# Patient Record
Sex: Female | Born: 1950 | Race: White | Hispanic: No | Marital: Married | Smoking: Light tobacco smoker
Health system: Southern US, Community
[De-identification: ages and names within clinical notes are randomized; demographics above are authoritative.]

## PROBLEM LIST (undated history)

## (undated) DIAGNOSIS — H409 Unspecified glaucoma: Secondary | ICD-10-CM

---

## 2006-02-21 ENCOUNTER — Ambulatory Visit: Payer: Self-pay | Admitting: Obstetrics and Gynecology

## 2007-06-20 ENCOUNTER — Ambulatory Visit: Payer: Self-pay | Admitting: Obstetrics and Gynecology

## 2010-07-14 ENCOUNTER — Ambulatory Visit: Payer: Self-pay | Admitting: Internal Medicine

## 2014-05-16 ENCOUNTER — Emergency Department: Payer: Self-pay | Admitting: Emergency Medicine

## 2015-09-08 DIAGNOSIS — Z86007 Personal history of in-situ neoplasm of skin: Secondary | ICD-10-CM

## 2015-09-08 HISTORY — DX: Personal history of in-situ neoplasm of skin: Z86.007

## 2017-10-06 ENCOUNTER — Other Ambulatory Visit: Payer: Self-pay

## 2017-10-06 ENCOUNTER — Emergency Department
Admission: EM | Admit: 2017-10-06 | Discharge: 2017-10-06 | Disposition: A | Discharge status: Home / Self Care | Payer: No Typology Code available for payment source | Attending: Emergency Medicine | Admitting: Emergency Medicine

## 2017-10-06 ENCOUNTER — Emergency Department: Payer: No Typology Code available for payment source

## 2017-10-06 ENCOUNTER — Encounter: Payer: Self-pay | Admitting: Emergency Medicine

## 2017-10-06 DIAGNOSIS — F17200 Nicotine dependence, unspecified, uncomplicated: Secondary | ICD-10-CM | POA: Diagnosis not present

## 2017-10-06 DIAGNOSIS — Y9389 Activity, other specified: Secondary | ICD-10-CM | POA: Insufficient documentation

## 2017-10-06 DIAGNOSIS — M542 Cervicalgia: Secondary | ICD-10-CM | POA: Insufficient documentation

## 2017-10-06 HISTORY — DX: Unspecified glaucoma: H40.9

## 2017-10-06 MED ORDER — IBUPROFEN 400 MG PO TABS: 400.0000 mg | ORAL_TABLET | Freq: Four times a day (QID) | ORAL | 0 refills | Status: DC | PRN
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
## 2018-01-22 ENCOUNTER — Other Ambulatory Visit: Payer: Self-pay | Admitting: Internal Medicine

## 2018-01-22 DIAGNOSIS — Z1231 Encounter for screening mammogram for malignant neoplasm of breast: Secondary | ICD-10-CM

## 2018-02-09 ENCOUNTER — Ambulatory Visit
Admission: RE | Admit: 2018-02-09 | Discharge: 2018-02-09 | Disposition: A | Discharge status: Home / Self Care | Payer: Medicare Other | Source: Ambulatory Visit | Attending: Internal Medicine | Admitting: Internal Medicine

## 2018-02-09 DIAGNOSIS — Z1231 Encounter for screening mammogram for malignant neoplasm of breast: Secondary | ICD-10-CM | POA: Diagnosis not present

## 2018-02-14 ENCOUNTER — Other Ambulatory Visit: Payer: Self-pay | Admitting: Internal Medicine

## 2018-02-14 DIAGNOSIS — R928 Other abnormal and inconclusive findings on diagnostic imaging of breast: Secondary | ICD-10-CM

## 2018-02-14 DIAGNOSIS — R921 Mammographic calcification found on diagnostic imaging of breast: Secondary | ICD-10-CM

## 2018-02-14 DIAGNOSIS — N6320 Unspecified lump in the left breast, unspecified quadrant: Secondary | ICD-10-CM

## 2018-02-16 ENCOUNTER — Ambulatory Visit: Payer: No Typology Code available for payment source

## 2018-02-16 ENCOUNTER — Other Ambulatory Visit: Payer: Medicare Other

## 2018-03-06 ENCOUNTER — Ambulatory Visit
Admission: RE | Admit: 2018-03-06 | Discharge: 2018-03-06 | Disposition: A | Discharge status: Home / Self Care | Payer: Medicare Other | Source: Ambulatory Visit | Attending: Internal Medicine | Admitting: Internal Medicine

## 2018-03-06 DIAGNOSIS — R921 Mammographic calcification found on diagnostic imaging of breast: Secondary | ICD-10-CM | POA: Diagnosis present

## 2018-03-06 DIAGNOSIS — R928 Other abnormal and inconclusive findings on diagnostic imaging of breast: Secondary | ICD-10-CM

## 2018-03-06 DIAGNOSIS — N6320 Unspecified lump in the left breast, unspecified quadrant: Secondary | ICD-10-CM | POA: Insufficient documentation

## 2018-03-13 ENCOUNTER — Other Ambulatory Visit: Payer: Self-pay | Admitting: Internal Medicine

## 2018-03-13 DIAGNOSIS — R921 Mammographic calcification found on diagnostic imaging of breast: Secondary | ICD-10-CM

## 2018-03-13 DIAGNOSIS — R928 Other abnormal and inconclusive findings on diagnostic imaging of breast: Secondary | ICD-10-CM

## 2018-03-20 ENCOUNTER — Ambulatory Visit
Admission: RE | Admit: 2018-03-20 | Discharge: 2018-03-20 | Disposition: A | Discharge status: Home / Self Care | Payer: Medicare Other | Source: Ambulatory Visit | Attending: Internal Medicine | Admitting: Internal Medicine

## 2018-03-20 DIAGNOSIS — R928 Other abnormal and inconclusive findings on diagnostic imaging of breast: Secondary | ICD-10-CM | POA: Insufficient documentation

## 2018-03-20 DIAGNOSIS — R921 Mammographic calcification found on diagnostic imaging of breast: Secondary | ICD-10-CM | POA: Insufficient documentation

## 2018-03-20 HISTORY — PX: BREAST BIOPSY: SHX20

## 2018-03-22 LAB — SURGICAL PATHOLOGY

## 2018-03-29 ENCOUNTER — Other Ambulatory Visit: Payer: Self-pay | Admitting: Primary Care

## 2018-03-29 ENCOUNTER — Ambulatory Visit
Admission: RE | Admit: 2018-03-29 | Discharge: 2018-03-29 | Disposition: A | Discharge status: Home / Self Care | Payer: Medicare Other | Source: Ambulatory Visit | Attending: Primary Care | Admitting: Primary Care

## 2018-03-29 DIAGNOSIS — R109 Unspecified abdominal pain: Secondary | ICD-10-CM

## 2018-03-29 DIAGNOSIS — R1030 Lower abdominal pain, unspecified: Secondary | ICD-10-CM | POA: Diagnosis not present

## 2018-10-14 IMAGING — CT CT CERVICAL SPINE W/O CM
3 of 4 series · 11 of 33 positions shown, 13 images · non-contrast
Comparison: 05/16/2014 CT

CLINICAL DATA: Neck pain

EXAM:
CT CERVICAL SPINE WITHOUT CONTRAST
TECHNIQUE: Multidetector CT imaging of the cervical spine was performed without
intravenous contrast. Multiplanar CT image reconstructions were also
generated.

[Series 4: sagittal bone · sagittal · 0.30mm/px · 5 of 42 slices shown, 6 images]
[im 14/42  bone]
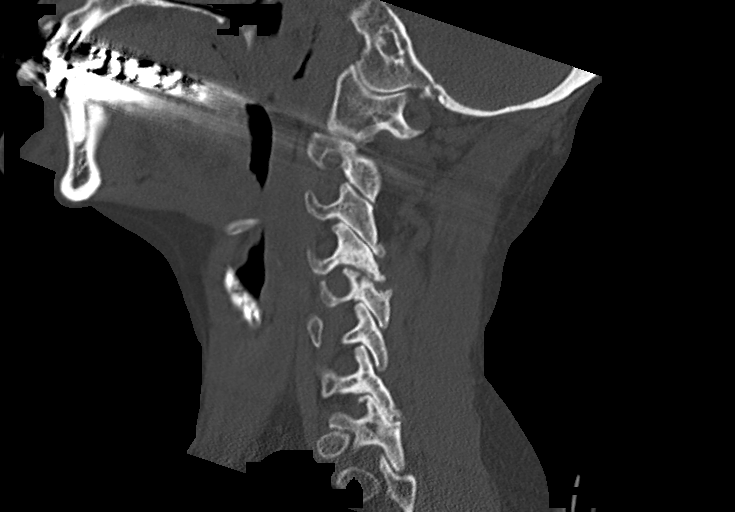
[im 18/42  bone]
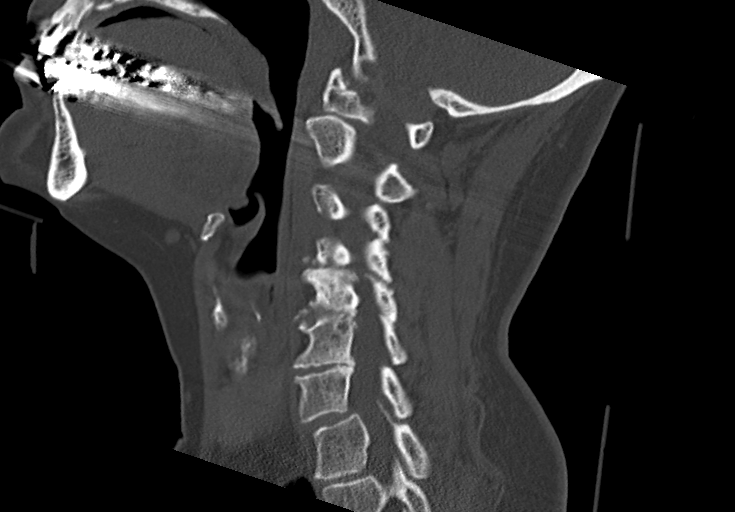
[im 21/42  soft-tissue]
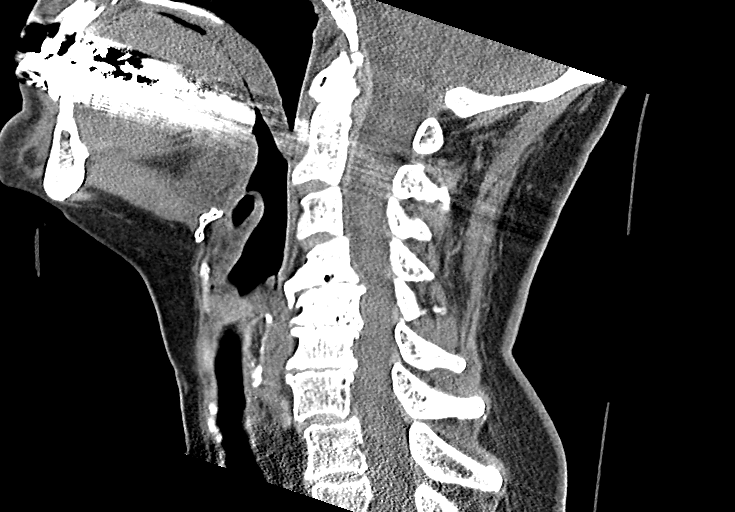
[im 21/42  bone]
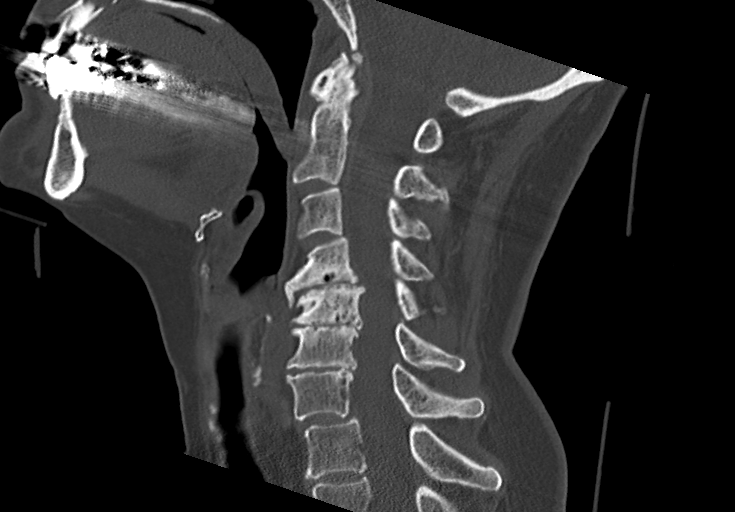
[im 24/42  bone]
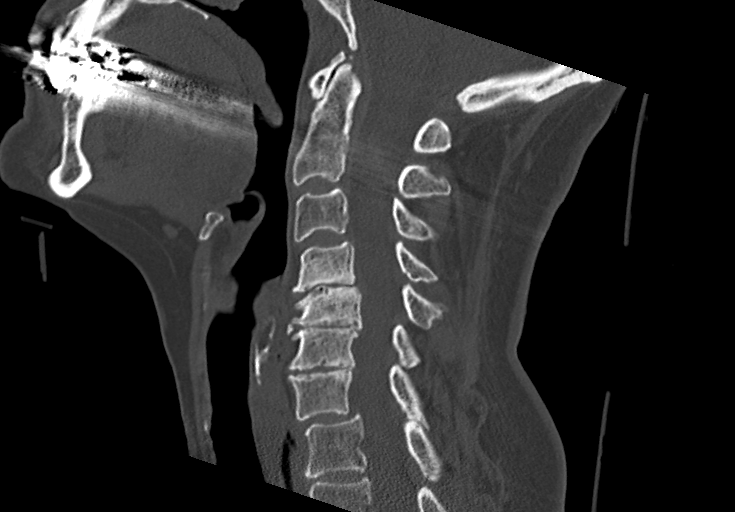
[im 28/42  bone]
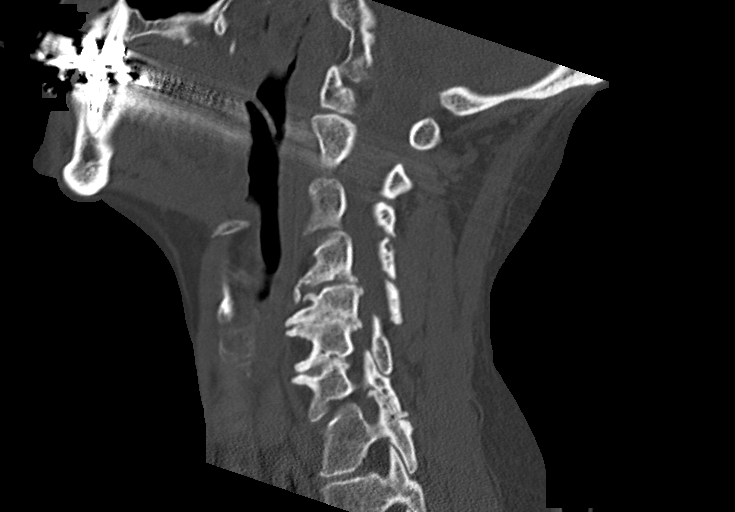

[Series 5: coronal bone · coronal · 0.30mm/px · 3 of 51 slices shown]
[im 11/51  bone]
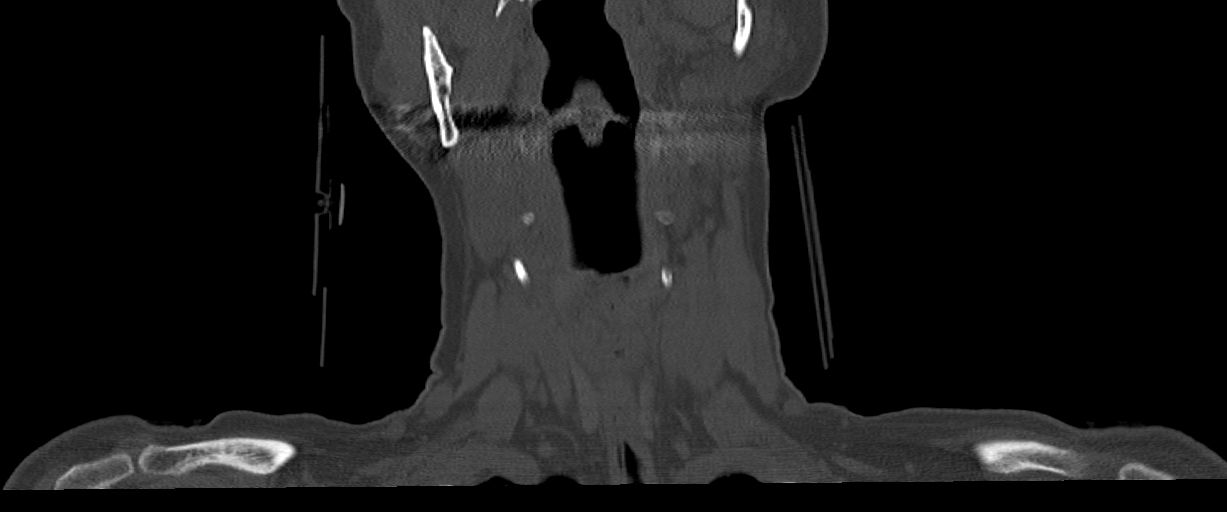
[im 21/51  bone]
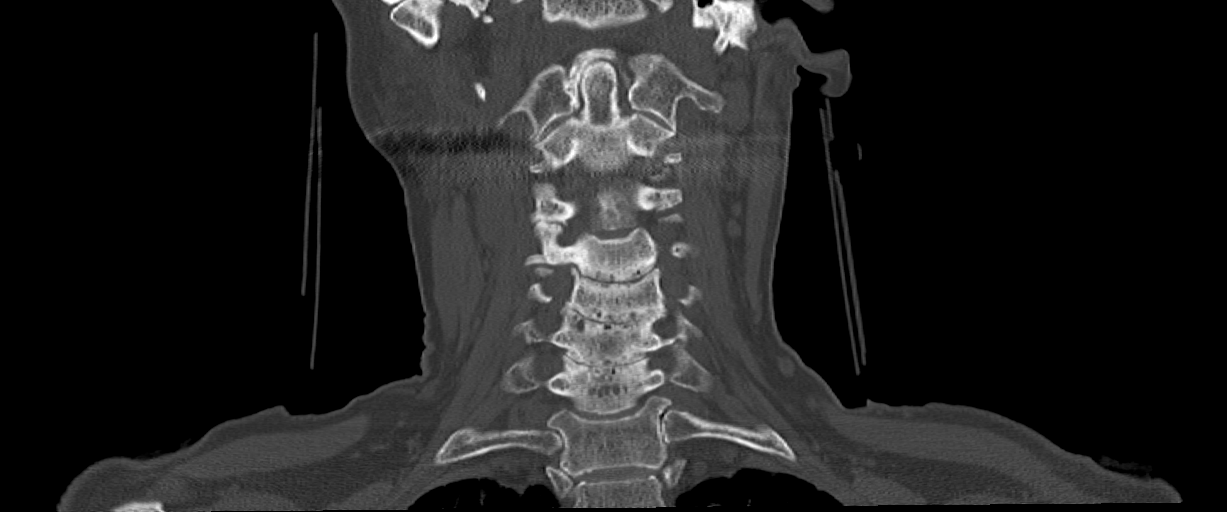
[im 31/51  bone]
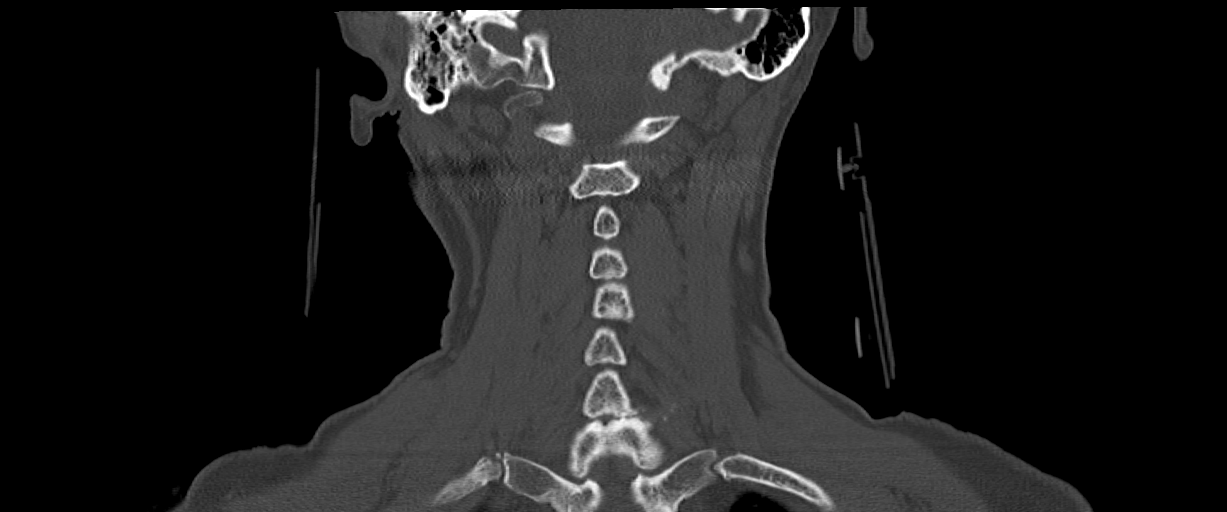

[Series 6: orthogonal bone · axial · 0.25mm/px · z∈[-227,-128]mm · 3 of 78 slices shown, 4 images]
[im 13/78  soft-tissue]
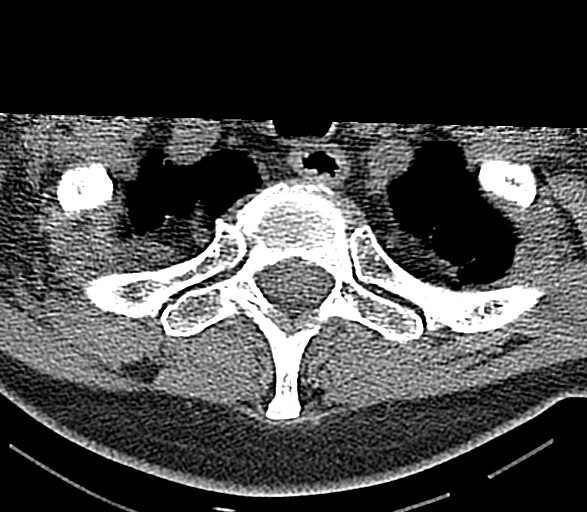
[im 13/78  bone]
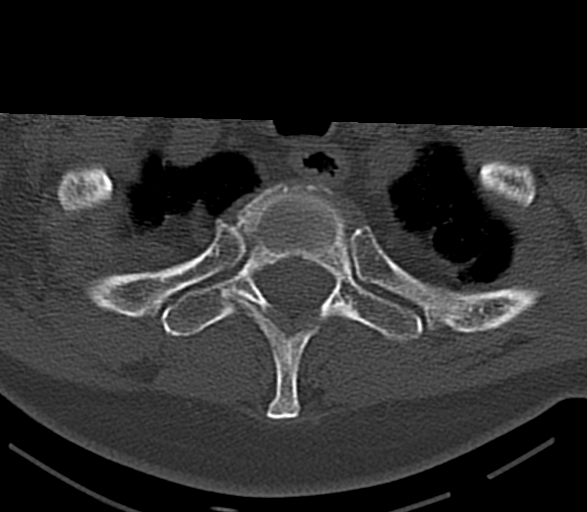
[im 39/78  bone]
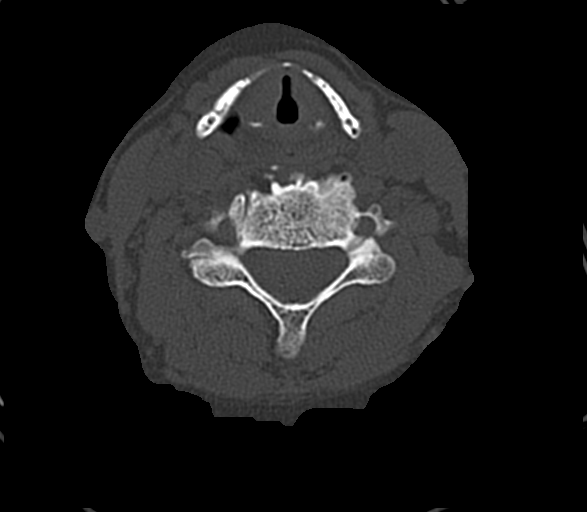
[im 65/78  bone]
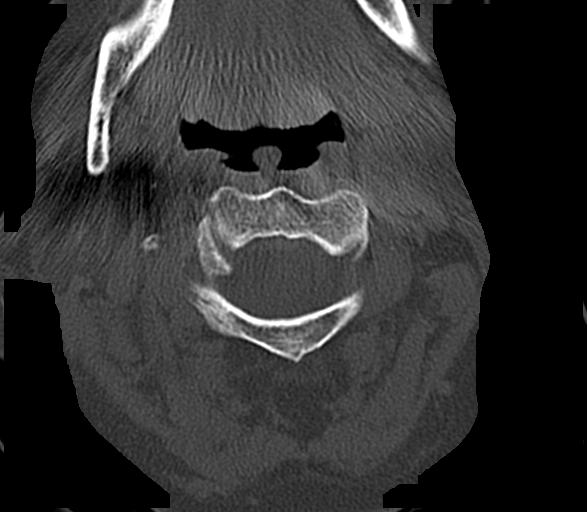

[11 of 33 positions shown; findings below may reference images not displayed]

FINDINGS: Alignment: Mild reversal of cervical lordosis. Trace anterolisthesis
of C7 on T1, no change. Facet alignment within normal limits.

Skull base and vertebrae: No acute fracture. No primary bone lesion
or focal pathologic process.

Soft tissues and spinal canal: No prevertebral fluid or swelling. No
visible canal hematoma.

Disc levels: Marked degenerative disc changes C4 through C7.
Multiple level bilateral facet degenerative change with marked facet
degenerative change on the right at C4-C5. Moderate left foraminal
stenosis at C5-C6 and moderate bilateral foraminal stenosis at C4-C5

Upper chest: Biapical scarring. No thyroid mass. Carotid vascular
disease.

Other: None
IMPRESSION: 1. Mild reversal of cervical lordosis with stable trace
anterolisthesis of C7 on T1. No acute osseous abnormality is seen
2. Advanced degenerative changes C4 through C7

## 2019-06-14 IMAGING — US US BREAST*L* LIMITED INC AXILLA
1 series · 6 of 6 positions shown · non-contrast
Comparison: Previous exam(s).

CLINICAL DATA: Patient presents for additional views of both
breasts as follow-up to recent screening exam to evaluate right
breast microcalcifications and left breast mass.

EXAM:
DIGITAL DIAGNOSTIC bilateral MAMMOGRAM
ULTRASOUND left BREAST

[Series 1: us breast*left* limited inc axilla · 0.03mm/px · 6 of 6 slices shown]
[im 1/6]
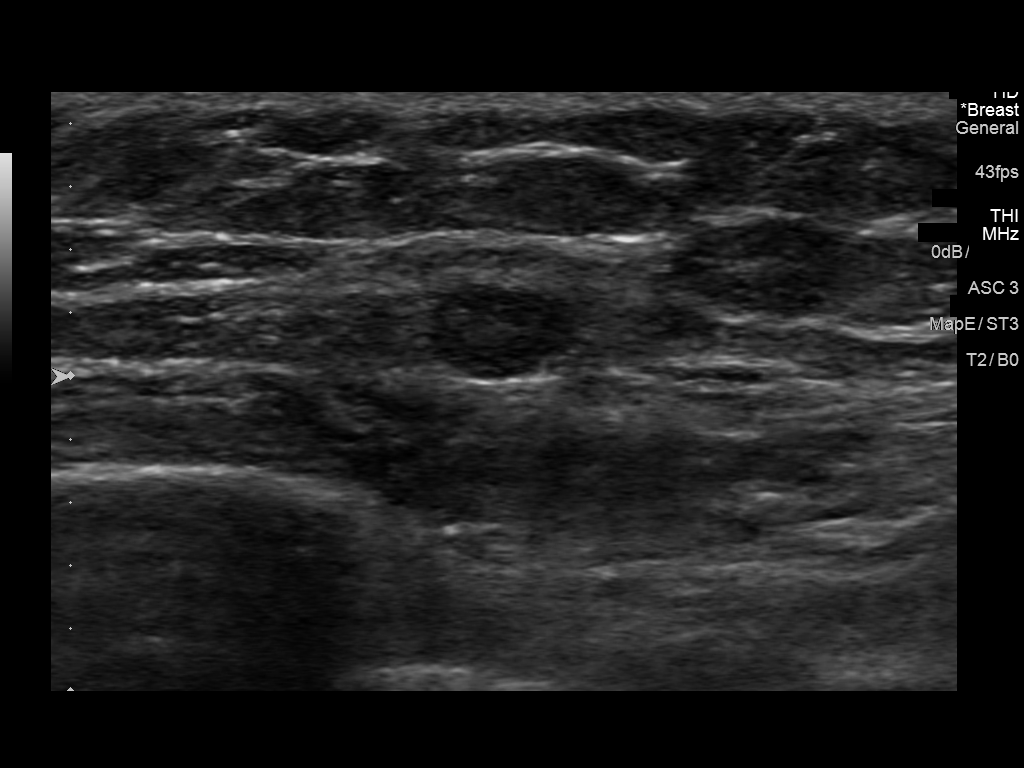
[im 2/6]
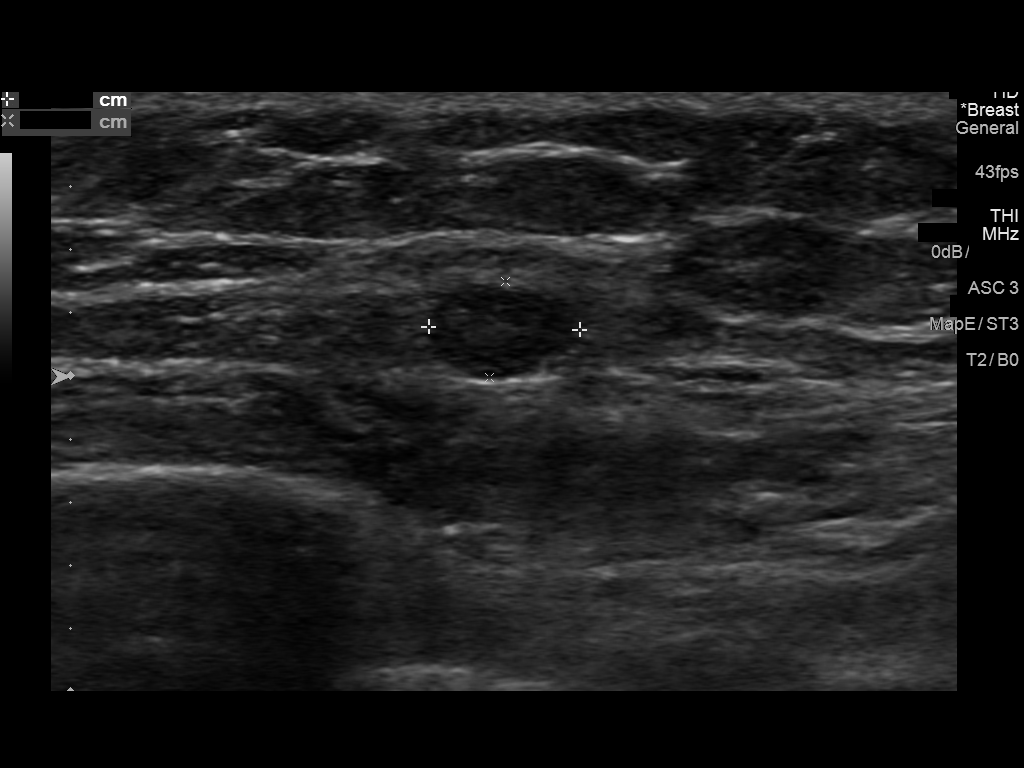
[im 3/6]
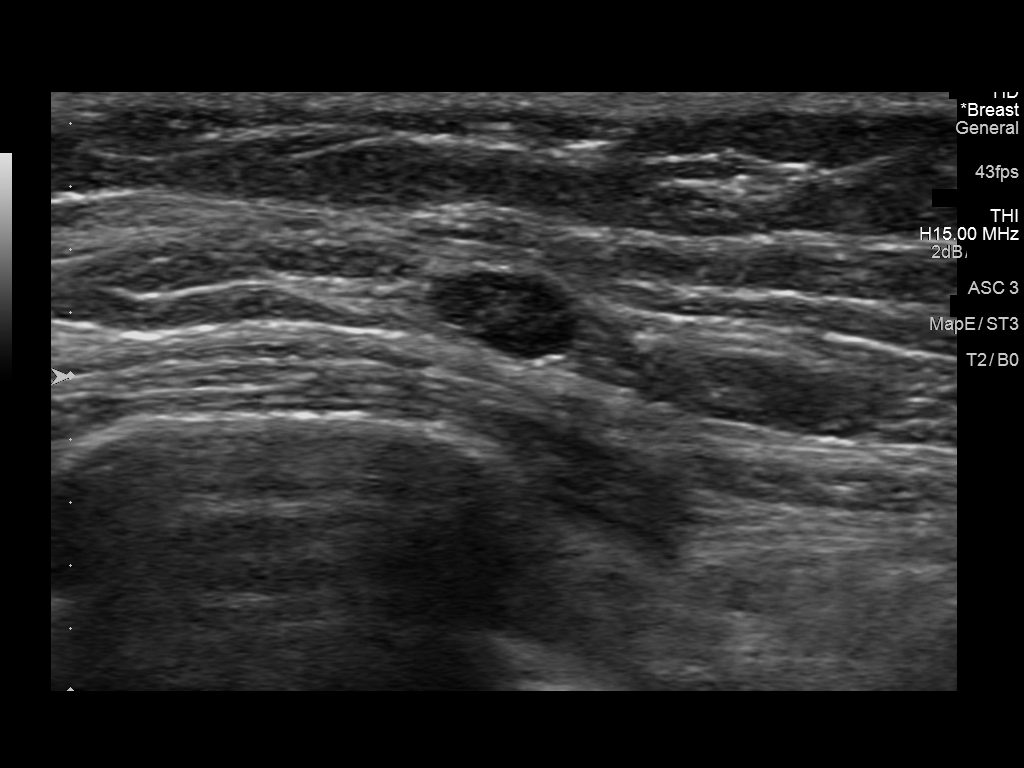
[im 4/6]
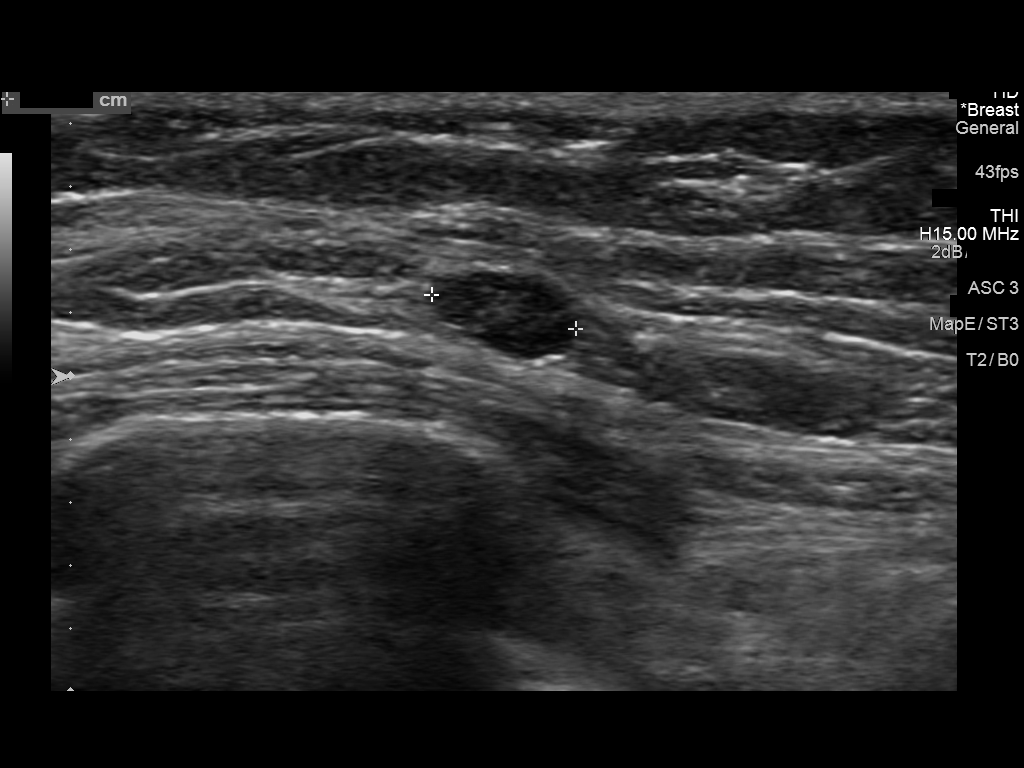
[im 5/6]
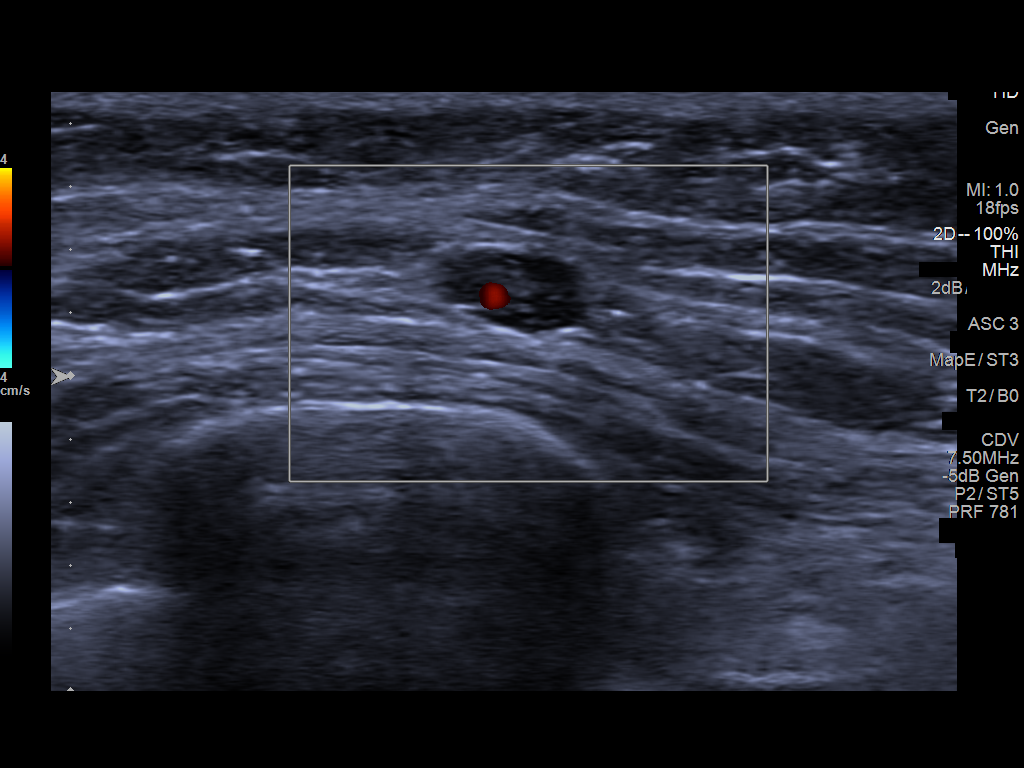
[im 6/6]
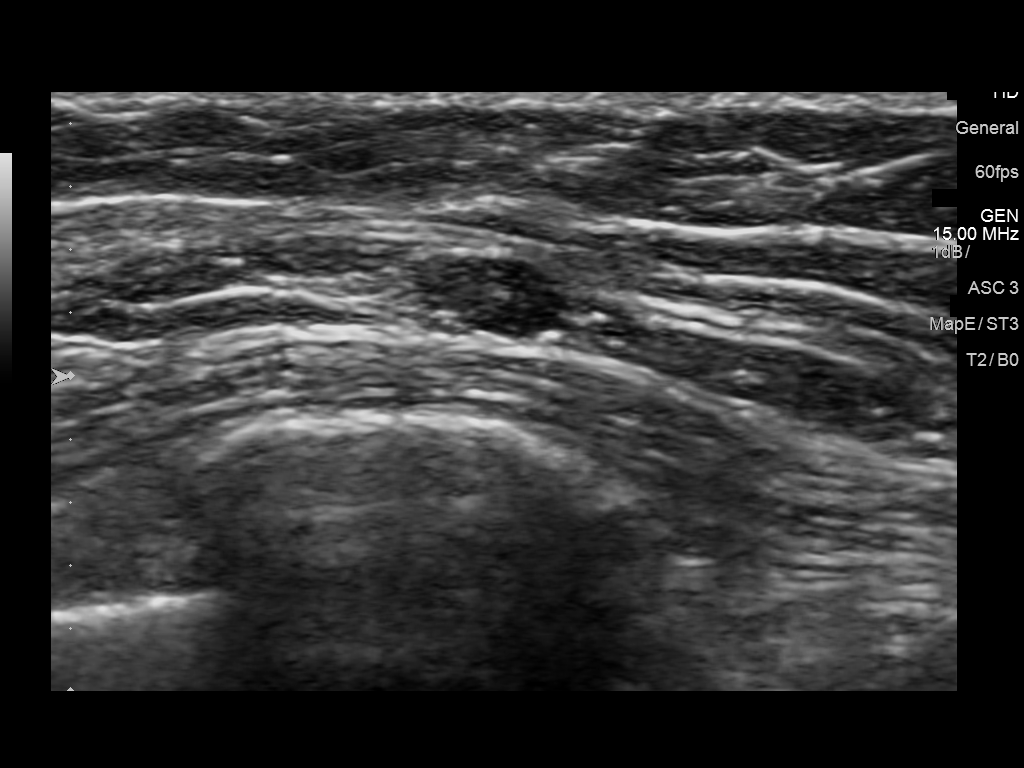

[6 of 6 positions shown; findings below may reference images not displayed]

ACR Breast Density Category b: There are scattered areas of
fibroglandular density.
FINDINGS: Additional images of the left breast demonstrate an oval
circumscribed subcentimeter mass over the posterior third of the
outer mid breast likely an intramammary lymph node.

Additional images of the right breast demonstrate a group of round
microcalcifications in a linear and branching pattern spanning 3 x 4
x 8 mm over the posterior third of the outer lower breast.

Targeted ultrasound is performed, showing a normal intramammary
lymph node over the 3 o'clock position of the left breast 13 cm from
the nipple measuring 3 x 5 x 5 mm corresponding to the mammographic
abnormality.
IMPRESSION: Group of indeterminate microcalcifications over the posterior third
of the outer lower right breast spanning 3 x 4 x 8 mm.

5 mm normal intramammary lymph node over the 3 o'clock position of
the left breast.

RECOMMENDATION:
Recommend stereotactic core needle biopsy of the indeterminate
microcalcifications right breast.

I have discussed the findings and recommendations with the patient.
Results were also provided in writing at the conclusion of the
visit. If applicable, a reminder letter will be sent to the patient
regarding the next appointment.

BI-RADS CATEGORY  4: Suspicious.

Patient's physician office will be contacted regarding scheduling of
biopsy as patient will then be notified of biopsy date and time here
at [HOSPITAL].

## 2019-07-07 IMAGING — US US PELVIS LIMITED
1 series · 14 of 25 positions shown · non-contrast
Comparison: None.

CLINICAL DATA: Lower abdominal pain, suprapubic pain with
difficulty voiding.

EXAM:
LIMITED ULTRASOUND OF PELVIS
TECHNIQUE: Limited transabdominal ultrasound examination of the pelvis was
performed.

[Series 1: us pelvis limited · 0.17mm/px · 29 acquisitions, 14 frames shown]
[im 1/29]
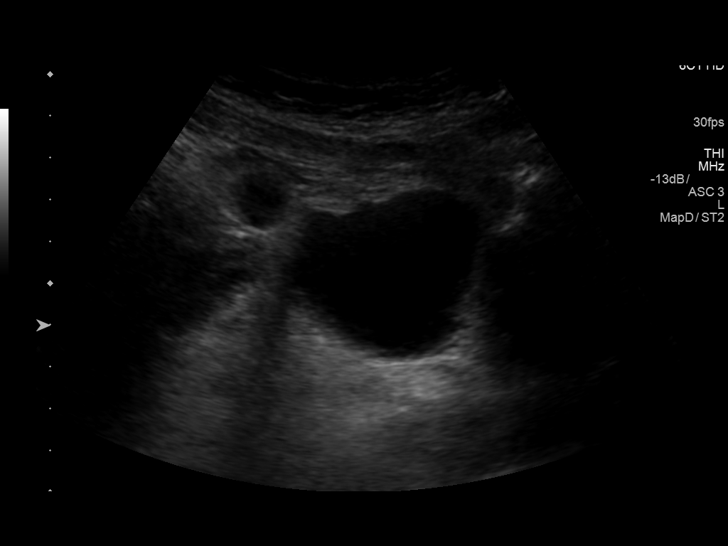
[im 3/29]
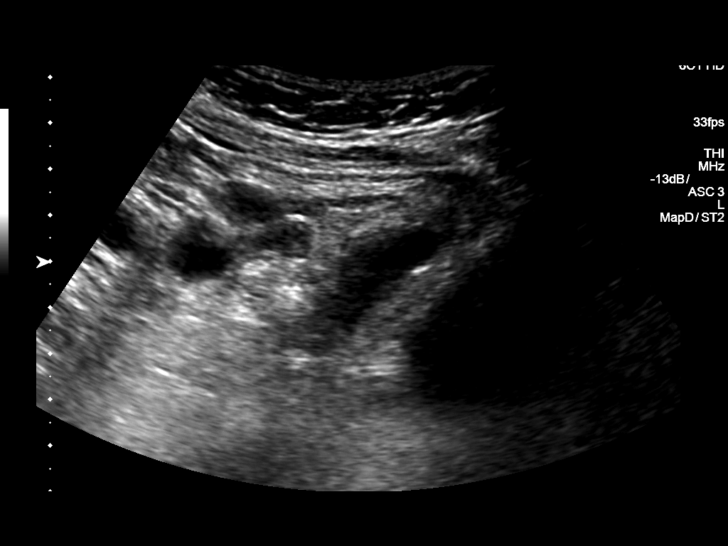
[im 5/29]
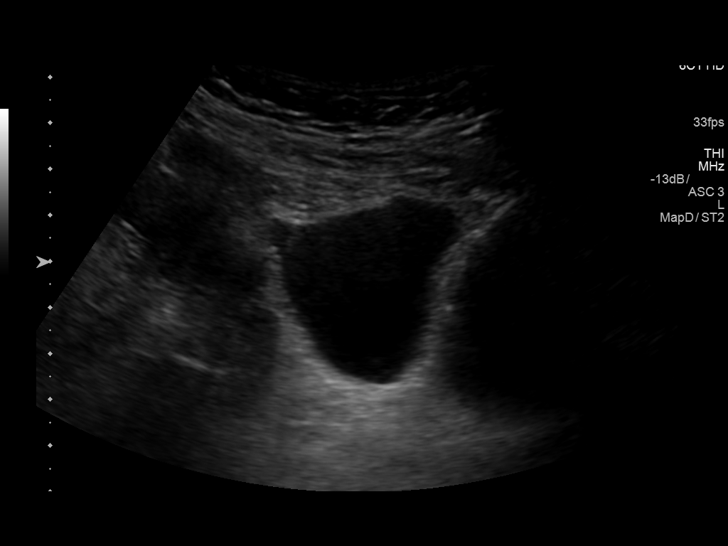
[im 8/29]
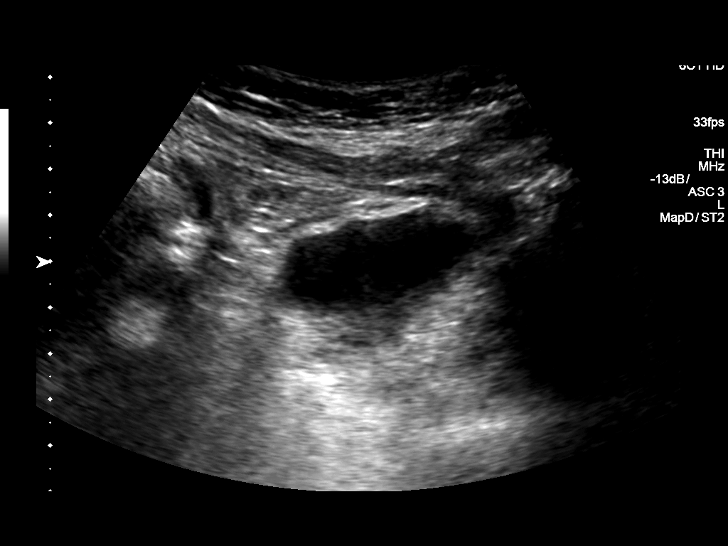
[im 10/29]
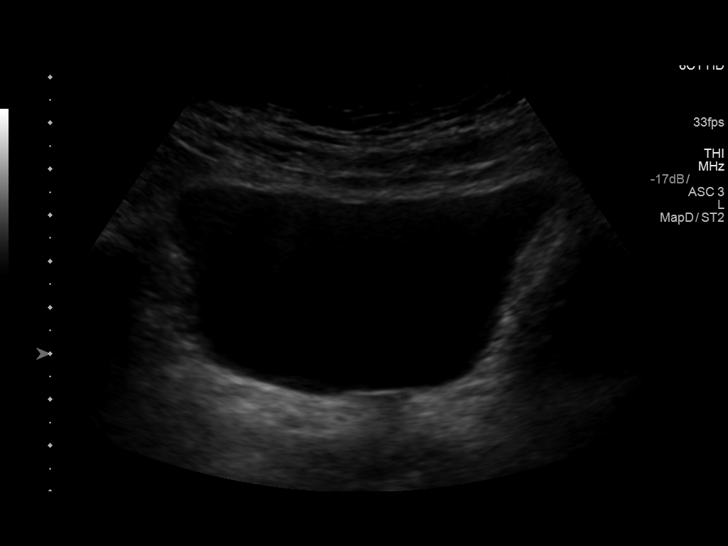
[im 11/29]
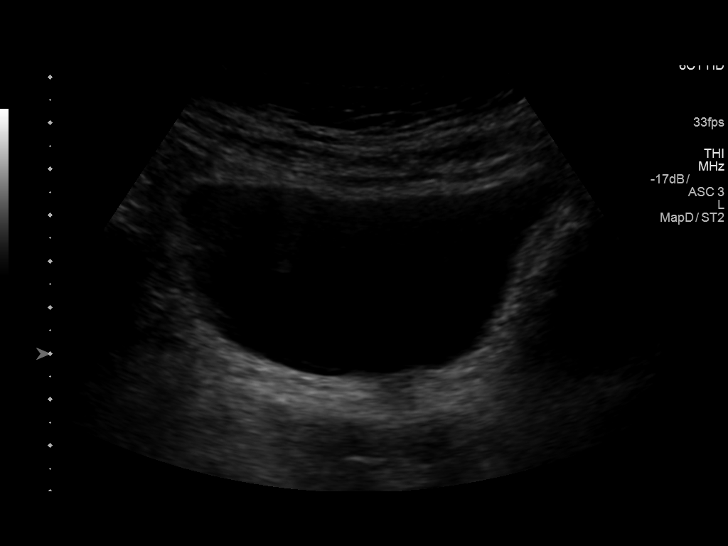
[im 13/29]
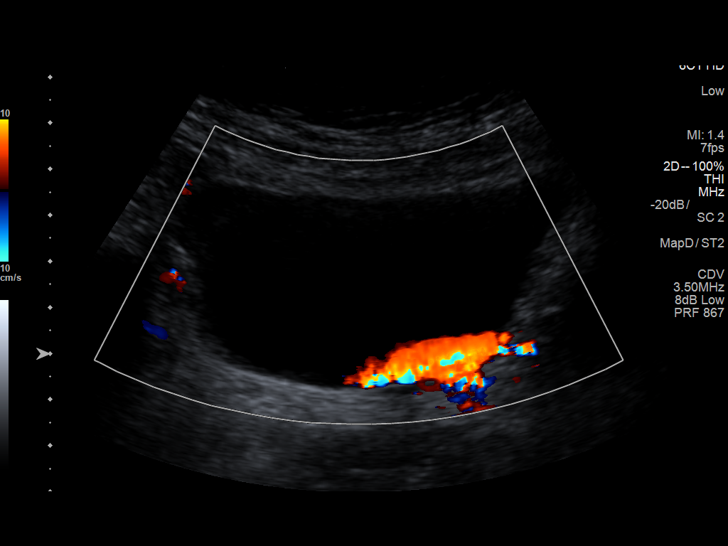
[im 16/29]
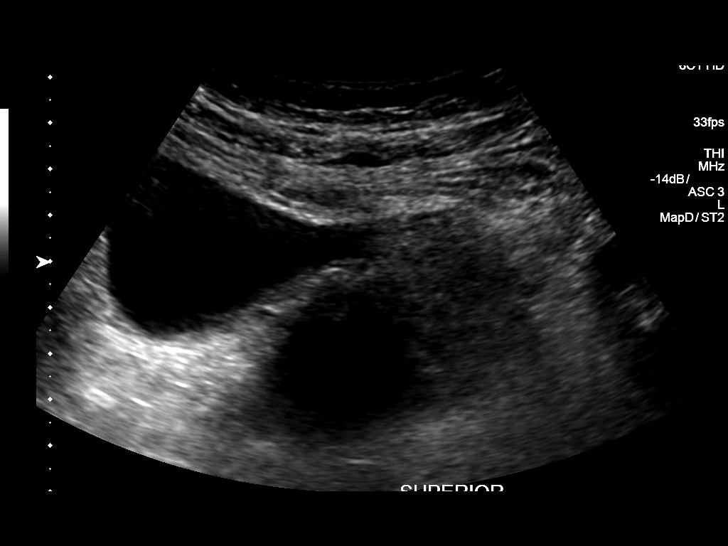
[im 18/29]
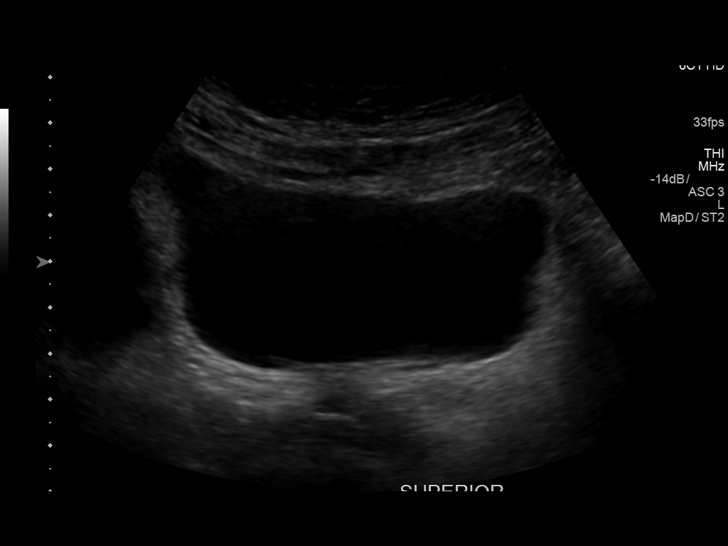
[im 19/29]
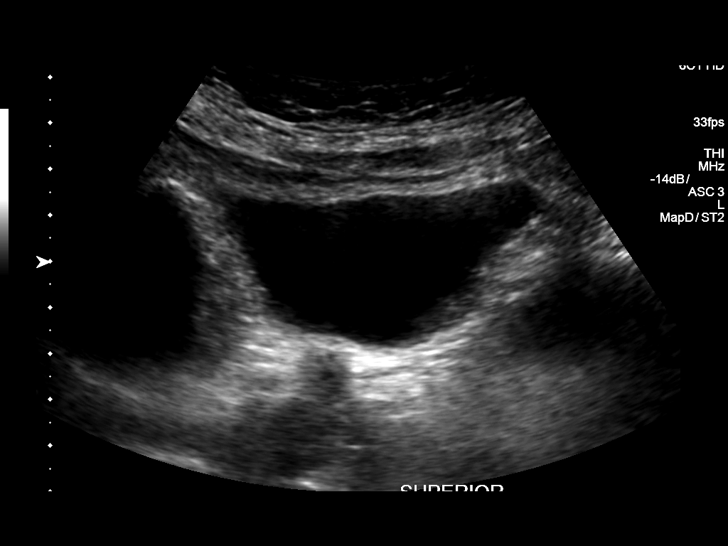
[im 22/29]
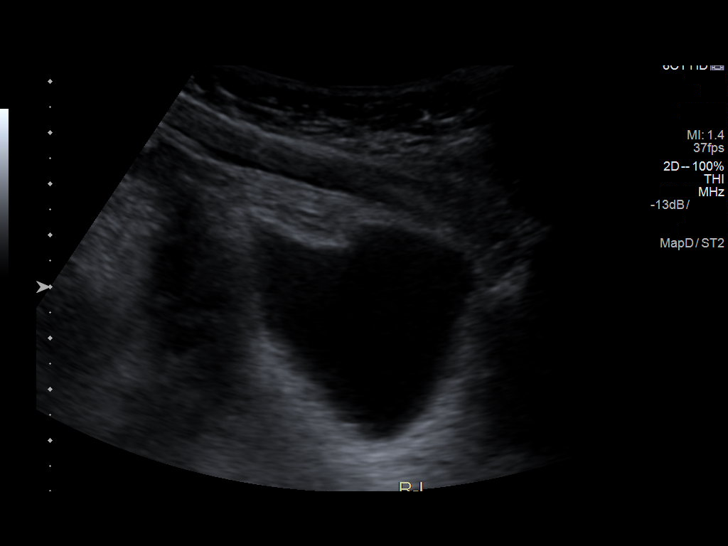
[im 24/29]
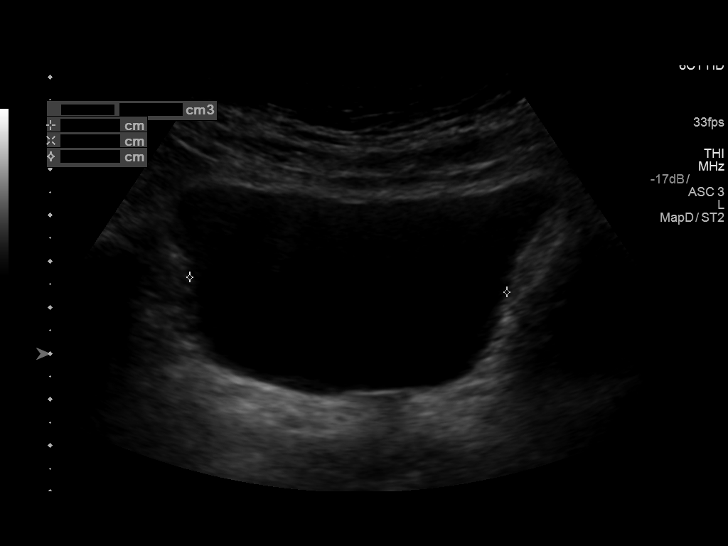
[im 26/29]
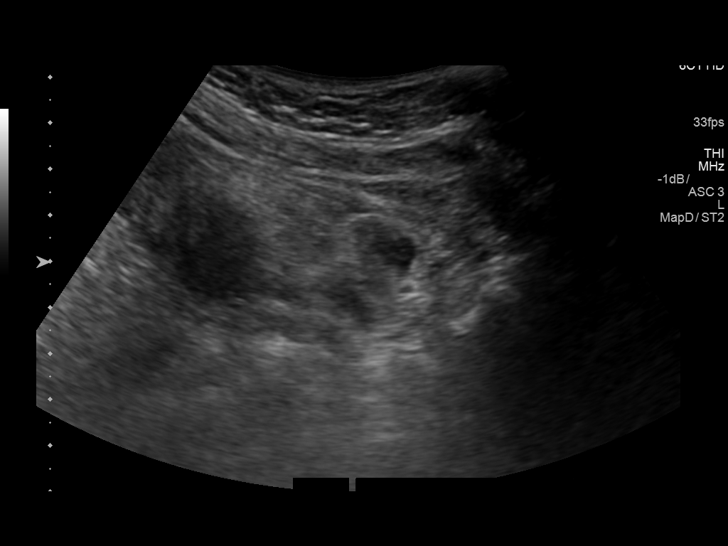
[im 29/29]
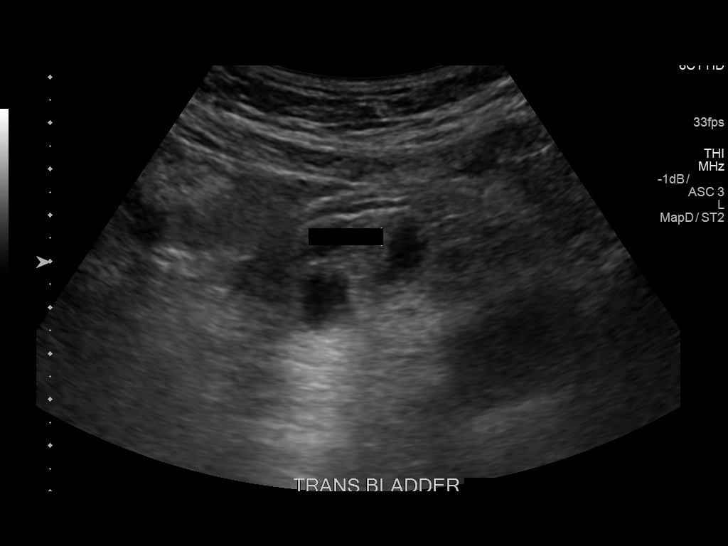

[14 of 25 positions shown; findings below may reference images not displayed]

FINDINGS: The urinary bladder has a normal appearance for the degree of
distention.

Pre-void volume:  77.4 ml

Post-void volume:  0 ml

Other findings: Both distal ureters are shown to be patent at the
level the bladder (bilateral ureteral jets visualized).
IMPRESSION: Normal ultrasound of the bladder.

## 2021-04-20 ENCOUNTER — Ambulatory Visit: Payer: PRIVATE HEALTH INSURANCE | Admitting: Dermatology

## 2022-09-22 ENCOUNTER — Ambulatory Visit: Payer: PRIVATE HEALTH INSURANCE | Admitting: Dermatology

## 2023-05-18 ENCOUNTER — Ambulatory Visit: Payer: Medicare HMO | Admitting: Dermatology

## 2023-05-18 DIAGNOSIS — D492 Neoplasm of unspecified behavior of bone, soft tissue, and skin: Secondary | ICD-10-CM

## 2023-05-18 DIAGNOSIS — C44319 Basal cell carcinoma of skin of other parts of face: Secondary | ICD-10-CM

## 2023-05-18 DIAGNOSIS — W908XXA Exposure to other nonionizing radiation, initial encounter: Secondary | ICD-10-CM | POA: Diagnosis not present

## 2023-05-18 DIAGNOSIS — L578 Other skin changes due to chronic exposure to nonionizing radiation: Secondary | ICD-10-CM | POA: Diagnosis not present

## 2023-05-18 DIAGNOSIS — Z8589 Personal history of malignant neoplasm of other organs and systems: Secondary | ICD-10-CM

## 2023-05-18 DIAGNOSIS — Z85828 Personal history of other malignant neoplasm of skin: Secondary | ICD-10-CM | POA: Diagnosis not present

## 2023-05-18 DIAGNOSIS — D489 Neoplasm of uncertain behavior, unspecified: Secondary | ICD-10-CM

## 2023-05-18 DIAGNOSIS — L570 Actinic keratosis: Secondary | ICD-10-CM

## 2023-05-18 NOTE — Patient Instructions (Addendum)
Actinic keratoses are precancerous spots that appear secondary to cumulative UV radiation exposure/sun exposure over time. They are chronic with expected duration over 1 year. A portion of actinic keratoses will progress to squamous cell carcinoma of the skin. It is not possible to reliably predict which spots will progress to skin cancer and so treatment is recommended to prevent development of skin cancer.  Recommend daily broad spectrum sunscreen SPF 30+ to sun-exposed areas, reapply every 2 hours as needed.  Recommend staying in the shade or wearing long sleeves, sun glasses (UVA+UVB protection) and wide brim hats (4-inch brim around the entire circumference of the hat). Call for new or changing lesions.   Cryotherapy Aftercare  Wash gently with soap and water everyday.   Apply Vaseline and Band-Aid daily until healed.   Biopsy Wound Care Instructions  Leave the original bandage on for 24 hours if possible.  If the bandage becomes soaked or soiled before that time, it is OK to remove it and examine the wound.  A small amount of post-operative bleeding is normal.  If excessive bleeding occurs, remove the bandage, place gauze over the site and apply continuous pressure (no peeking) over the area for 30 minutes. If this does not work, please call our clinic as soon as possible or page your doctor if it is after hours.   Once a day, cleanse the wound with soap and water. It is fine to shower. If a thick crust develops you may use a Q-tip dipped into dilute hydrogen peroxide (mix 1:1 with water) to dissolve it.  Hydrogen peroxide can slow the healing process, so use it only as needed.    After washing, apply petroleum jelly (Vaseline) or an antibiotic ointment if your doctor prescribed one for you, followed by a bandage.    For best healing, the wound should be covered with a layer of ointment at all times. If you are not able to keep the area covered with a bandage to hold the ointment in place,  this may mean re-applying the ointment several times a day.  Continue this wound care until the wound has healed and is no longer open.   Itching and mild discomfort is normal during the healing process. However, if you develop pain or severe itching, please call our office.   If you have any discomfort, you can take Tylenol (acetaminophen) or ibuprofen as directed on the bottle. (Please do not take these if you have an allergy to them or cannot take them for another reason).  Some redness, tenderness and white or yellow material in the wound is normal healing.  If the area becomes very sore and red, or develops a thick yellow-green material (pus), it may be infected; please notify us.    If you have stitches, return to clinic as directed to have the stitches removed. You will continue wound care for 2-3 days after the stitches are removed.   Wound healing continues for up to one year following surgery. It is not unusual to experience pain in the scar from time to time during the interval.  If the pain becomes severe or the scar thickens, you should notify the office.    A slight amount of redness in a scar is expected for the first six months.  After six months, the redness will fade and the scar will soften and fade.  The color difference becomes less noticeable with time.  If there are any problems, return for a post-op surgery check at your  earliest convenience.  To improve the appearance of the scar, you can use silicone scar gel, cream, or sheets (such as Mederma or Serica) every night for up to one year. These are available over the counter (without a prescription).  Please call our office at [phone] for any questions or concerns.     Due to recent changes in healthcare laws, you may see results of your pathology and/or laboratory studies on MyChart before the doctors have had a chance to review them. We understand that in some cases there may be results that are confusing or  concerning to you. Please understand that not all results are received at the same time and often the doctors may need to interpret multiple results in order to provide you with the best plan of care or course of treatment. Therefore, we ask that you please give [place] 2 business days to thoroughly review all your results before contacting the office for clarification. Should we see a critical lab result, you will be contacted sooner.   If You Need Anything After Your Visit  If you have any questions or concerns for your doctor, please call our main line at [phone] and press option 4 to reach your doctor's medical assistant. If no one answers, please leave a voicemail as directed and we will return your call as soon as possible. Messages left after 4 pm will be answered the following business day.   You may also send [place] a message via MyChart. We typically respond to MyChart messages within 1-2 business days.  For prescription refills, please ask your pharmacy to contact our office. Our fax number is [phone].  If you have an urgent issue when the clinic is closed that cannot wait until the next business day, you can page your doctor at the number below.    Please note that while we do our best to be available for urgent issues outside of office hours, we are not available 24/7.   If you have an urgent issue and are unable to reach [place], you may choose to seek medical care at your doctor's office, retail clinic, urgent care center, or emergency room.  If you have a medical emergency, please immediately call 911 or go to the emergency department.  Pager Numbers  - Dr. Gwen Pounds: [phone]  - Dr. Roseanne Reno: [phone]  - Dr. Katrinka Blazing: [phone]   In the event of inclement weather, please call our main line at [phone] for an update on the status of any delays or closures.  Dermatology Medication Tips: Please keep the boxes that topical medications come in in order to help keep  track of the instructions about where and how to use these. Pharmacies typically print the medication instructions only on the boxes and not directly on the medication tubes.   If your medication is too expensive, please contact our office at [phone] option 4 or send [place] a message through MyChart.   We are unable to tell what your co-pay for medications will be in advance as this is different depending on your insurance coverage. However, we may be able to find a substitute medication at lower cost or fill out paperwork to get insurance to cover a needed medication.   If a prior authorization is required to get your medication covered by your insurance company, please allow [place] 1-2 business days to complete this process.  Drug prices often vary depending on where the prescription is filled and some pharmacies may offer cheaper prices.  The website  www.goodrx.com contains coupons for medications through different pharmacies. The prices here do not account for what the cost may be with help from insurance (it may be cheaper with your insurance), but the website can give you the price if you did not use any insurance.  - You can print the associated coupon and take it with your prescription to the pharmacy.  - You may also stop by our office during regular business hours and pick up a GoodRx coupon card.  - If you need your prescription sent electronically to a different pharmacy, notify our office through  Hospital or by phone at [phone] option 4.     Si Usted Necesita Algo Despus de Su Visita  Tambin puede enviarnos un mensaje a travs de Clinical cytogeneticist. Por lo general respondemos a los mensajes de MyChart en el transcurso de 1 a 2 das hbiles.  Para renovar recetas, por favor pida a su farmacia que se ponga en contacto con nuestra oficina. Annie Sable de fax es [place] [phone].  Si tiene un asunto urgente cuando la clnica est cerrada y que no puede esperar hasta el  siguiente da hbil, puede llamar/localizar a su doctor(a) al nmero que aparece a continuacin.   Por favor, tenga en cuenta que aunque hacemos todo lo posible para estar disponibles para asuntos urgentes fuera del horario de [place], no estamos disponibles las 24 horas del da, los 7 [address]  [address] de la [place].   Si tiene un problema urgente y no puede comunicarse con nosotros, puede optar por buscar atencin mdica  en el consultorio de su doctor(a), en una clnica privada, en un centro de atencin urgente o en una sala de emergencias.  Si tiene Engineer, drilling, por favor llame inmediatamente al 911 o vaya a la sala de emergencias.  Nmeros de bper  - Dr. Gwen Pounds: [phone]  - Dra. Roseanne Reno: [phone]  - Dr. Katrinka Blazing: [phone]   En caso de inclemencias del tiempo, por favor llame a Lacy Duverney principal al [phone] para una actualizacin sobre el [place] de cualquier retraso o cierre.  Consejos para la medicacin en dermatologa: Por favor, guarde las cajas en las que vienen los medicamentos de uso tpico para ayudarle a seguir las instrucciones sobre dnde y cmo usarlos. Las farmacias generalmente imprimen las instrucciones del medicamento slo en las cajas y no directamente en los tubos del [place].   Si su medicamento es muy caro, por favor, pngase en contacto con Rolm Gala llamando al [phone] y presione la opcin 4 o envenos un mensaje a travs de Clinical cytogeneticist.   No podemos decirle cul ser su copago por los medicamentos por adelantado ya que esto es diferente dependiendo de la cobertura de su seguro. Sin embargo, es posible que podamos encontrar un medicamento sustituto a Audiological scientist un formulario para que el seguro cubra el medicamento que se considera necesario.   Si se requiere una autorizacin previa para que su compaa de seguros [place] su medicamento, por favor permtanos de 1 a 2 das hbiles para completar [address]  Los precios de los  medicamentos varan con frecuencia dependiendo del Environmental consultant de dnde se surte la receta y alguna farmacias pueden ofrecer precios ms baratos.  El sitio web www.goodrx.com tiene cupones para medicamentos de Health and safety inspector. Los precios aqu no tienen en cuenta lo que podra costar con la ayuda del seguro (puede ser ms barato con su seguro), pero el sitio web puede darle el precio si no utiliz Tourist information centre manager.  - Puede  imprimir el cupn correspondiente y llevarlo con su receta a la farmacia.  - Tambin puede pasar por nuestra oficina durante el horario de atencin regular y Education officer, museum una tarjeta de cupones de GoodRx.  - Si necesita que su receta se enve electrnicamente a una farmacia diferente, informe a nuestra oficina a travs de MyChart de [place] o por telfono llamando al [phone] y presione la opcin 4.

## 2023-05-18 NOTE — Progress Notes (Signed)
New Patient Visit   Subjective  Angel Matthews is a 72 y.o. female who presents for the following: concerned spot at left cheek reports has been changing color, patient has some crusted areas on nose.   Patient reports history of scc   The patient has spots, moles and lesions to be evaluated, some may be new or changing and the patient may have concern these could be cancer.   The following portions of the chart were reviewed this encounter and updated as appropriate: medications, allergies, medical history  Review of Systems:  No other skin or systemic complaints except as noted in HPI or Assessment and Plan.  Objective  Well appearing patient in no apparent distress; mood and affect are within normal limits.   A focused examination was performed of the following areas: face  Relevant exam findings are noted in the Assessment and Plan.  left zygoma x 1 Erythematous thin papules/macules with gritty scale.   left cheek 1.2 x 0.7 cm red patch        nasal tip 0.8 x 0.5 cm red patch     Assessment & Plan     Actinic keratosis left zygoma x 1  Actinic keratoses are precancerous spots that appear secondary to cumulative UV radiation exposure/sun exposure over time. They are chronic with expected duration over 1 year. A portion of actinic keratoses will progress to squamous cell carcinoma of the skin. It is not possible to reliably predict which spots will progress to skin cancer and so treatment is recommended to prevent development of skin cancer.  Recommend daily broad spectrum sunscreen SPF 30+ to sun-exposed areas, reapply every 2 hours as needed.  Recommend staying in the shade or wearing long sleeves, sun glasses (UVA+UVB protection) and wide brim hats (4-inch brim around the entire circumference of the hat). Call for new or changing lesions.  Destruction of lesion - left zygoma x 1 Complexity: simple   Destruction method: cryotherapy   Informed consent:  discussed and consent obtained   Timeout:  patient name, date of birth, surgical site, and procedure verified Lesion destroyed using liquid nitrogen: Yes   Region frozen until ice ball extended beyond lesion: Yes   Outcome: patient tolerated procedure well with no complications   Post-procedure details: wound care instructions given    Neoplasm of uncertain behavior (2) left cheek  Skin / nail biopsy Type of biopsy: tangential   Informed consent: discussed and consent obtained   Timeout: patient name, date of birth, surgical site, and procedure verified   Procedure prep:  Patient was prepped and draped in usual sterile fashion Prep type:  Isopropyl alcohol Anesthesia: the lesion was anesthetized in a standard fashion   Anesthetic:  1% lidocaine w/ epinephrine 1-100,000 buffered w/ 8.4% NaHCO3 Instrument used: flexible razor blade   Hemostasis achieved with: pressure, aluminum chloride and electrodesiccation   Outcome: patient tolerated procedure well   Post-procedure details: sterile dressing applied and wound care instructions given   Dressing type: petrolatum and bandage    Specimen 1 - Surgical pathology Differential Diagnosis: r/o bcc   Check Margins: No  nasal tip  R/o BCC  Will hold treatment at nasal tip today per patient's request, will plan to treat at next follow up r/o bcc     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  - Call if any new or changing lesions are noted between office visits   ACTINIC DAMAGE - chronic, secondary to cumulative UV radiation exposure/sun exposure over time - diffuse scaly erythematous macules with underlying dyspigmentation - Recommend daily broad spectrum sunscreen SPF 30+ to sun-exposed areas, reapply  every 2 hours as needed.  - Recommend staying in the shade or wearing long sleeves, sun glasses (UVA+UVB protection) and wide brim hats (4-inch brim around the entire circumference of the hat). - Call for new or changing lesions.  Crusted areas of nose. Pt declines evaluation of these today. Will check next visit   Return for january follow up for bx .  IAsher Muir, CMA, am acting as scribe for Armida Sans, MD.   Documentation: I have reviewed the above documentation for accuracy and completeness, and I agree with the above.  Armida Sans, MD

## 2023-05-24 LAB — SURGICAL PATHOLOGY

## 2023-05-25 ENCOUNTER — Telehealth: Payer: Self-pay

## 2023-05-25 DIAGNOSIS — C4491 Basal cell carcinoma of skin, unspecified: Secondary | ICD-10-CM

## 2023-05-25 HISTORY — DX: Basal cell carcinoma of skin, unspecified: C44.91

## 2023-05-25 NOTE — Telephone Encounter (Addendum)
Tried calling patient regarding results. No answer. LMOM for patient to call office.   ----- Message from Armida Sans sent at 05/25/2023  1:18 PM EST ----- FINAL DIAGNOSIS        1. Skin, left cheek :       BASAL CELL CARCINOMA, NODULAR PATTERN   Cancer = BCC Schedule for MOHS (can send to Dr Caralyn Guile)

## 2023-05-28 ENCOUNTER — Encounter: Payer: Self-pay | Admitting: Dermatology

## 2023-05-29 ENCOUNTER — Telehealth: Payer: Self-pay

## 2023-05-29 DIAGNOSIS — C44310 Basal cell carcinoma of skin of unspecified parts of face: Secondary | ICD-10-CM

## 2023-05-29 NOTE — Telephone Encounter (Signed)
Patient advised and referral sent to [facility] for Dr. Caralyn Guile. aw

## 2023-05-29 NOTE — Telephone Encounter (Addendum)
Tried calling patient regarding results. No answer. LMOM for patient to return call.   ----- Message from Armida Sans sent at 05/25/2023  1:18 PM EST ----- FINAL DIAGNOSIS        1. Skin, left cheek :       BASAL CELL CARCINOMA, NODULAR PATTERN   Cancer = BCC Schedule for MOHS (can send to Dr Caralyn Guile)

## 2023-05-29 NOTE — Addendum Note (Signed)
Addended by: Dorathy Daft R on: 05/29/2023 04:41 PM   Modules accepted: Orders

## 2023-06-14 ENCOUNTER — Ambulatory Visit: Payer: Medicare HMO | Admitting: Dermatology

## 2023-06-14 ENCOUNTER — Encounter: Payer: Self-pay | Admitting: Dermatology

## 2023-06-14 VITALS — BP 134/76 | HR 78 | Temp 98.0°F

## 2023-06-14 DIAGNOSIS — C44319 Basal cell carcinoma of skin of other parts of face: Secondary | ICD-10-CM

## 2023-06-14 DIAGNOSIS — L579 Skin changes due to chronic exposure to nonionizing radiation, unspecified: Secondary | ICD-10-CM

## 2023-06-14 DIAGNOSIS — L814 Other melanin hyperpigmentation: Secondary | ICD-10-CM

## 2023-06-14 DIAGNOSIS — C4491 Basal cell carcinoma of skin, unspecified: Secondary | ICD-10-CM

## 2023-06-14 MED ORDER — TRAMADOL HCL 50 MG PO TABS: 50.0000 mg | ORAL_TABLET | Freq: Four times a day (QID) | ORAL | 0 refills | Status: AC | PRN

## 2023-06-14 NOTE — Progress Notes (Signed)
Follow-Up Visit   Subjective  Angel Matthews is a 72 y.o. female who presents for the following: Mohs of nodular BCC of left cheek, referred by Dr. Gwen Pounds. Patient is accompanied by her husband,  The following portions of the chart were reviewed this encounter and updated as appropriate: medications, allergies, medical history  Review of Systems:  No other skin or systemic complaints except as noted in HPI or Assessment and Plan.  Objective  Well appearing patient in no apparent distress; mood and affect are within normal limits.  A focused examination was performed of the following areas: Left cheek Relevant physical exam findings are noted in the Assessment and Plan.   Left Cheek               Assessment & Plan   Basal cell carcinoma (BCC), unspecified site Left Cheek  Mohs surgery  Consent obtained: written  Anticoagulation: Is the patient taking prescription anticoagulant and/or aspirin prescribed/recommended by a physician? No   Was the anticoagulation regimen changed prior to Mohs? No    Procedure Details: Surgical site (from skin exam): Left Cheek Pre-operative length (cm): 1.2 Pre-operative width (cm): 1.8  Micrographic Surgery Details: Post-operative length (cm): 2.9 Post-operative width (cm): 1.6 Number of Mohs stages: 2  Skin repair Complexity:  Complex Final length (cm):  4.9 Subcutaneous layers (deep stitches):  Suture size:  5-0 Suture type: Monocryl (poliglecaprone 25)   Fine/surface layer approximation (top stitches):  Suture size:  5-0 Suture type: fast-absorbing plain gut    Related Medications traMADol (ULTRAM) 50 MG tablet Take 1 tablet (50 mg total) by mouth every 6 (six) hours as needed for up to 8 doses.    No follow-ups on file.  Owens Shark, CMA, am acting as scribe for Gwenith Daily, MD.    06/14/2023  HISTORY OF PRESENT ILLNESS  Angel Matthews is seen in consultation at the request of Dr. Gwen Pounds for  biopsy-proven nodular Basal Cell Carcinoma on the left cheek. They note that the area has been present for about 1 year increasing in size with time.  There is no history of previous treatment.  Reports no other new or changing lesions and has no other complaints today.  Medications and allergies: see patient chart.  Review of systems: Reviewed 8 systems and notable for the above skin cancer.  All other systems reviewed are unremarkable/negative, unless noted in the HPI. Past medical history, surgical history, family history, social history were also reviewed and are noted in the chart/questionnaire.    PHYSICAL EXAMINATION  General: Well-appearing, in no acute distress, alert and oriented x 4. Vitals reviewed in chart (if available).   Skin: Exam reveals a 1.2 x 1.8 cm erythematous papule and biopsy scar on the left cheek. There are rhytids, telangiectasias, and lentigines, consistent with photodamage.  Biopsy report(s) reviewed, confirming the diagnosis.   ASSESSMENT  1) Nodular Basal Cell Carcinoma of the left cheek 2) photodamage 3) solar lentigines   PLAN   1. Due to location, size, histology, or recurrence and the likelihood of subclinical extension as well as the need to conserve normal surrounding tissue, the patient was deemed acceptable for Mohs micrographic surgery (MMS).  The nature and purpose of the procedure, associated benefits and risks including recurrence and scarring, possible complications such as pain, infection, and bleeding, and alternative methods of treatment if appropriate were discussed with the patient during consent. The lesion location was verified by the patient, by reviewing previous notes, pathology reports, and by  photographs as well as angulation measurements if available.  Informed consent was reviewed and signed by the patient, and timeout was performed at 8:15 AM. See op note below.  2. For the photodamage and solar lentigines, sun protection  discussed/information given on OTC sunscreens, and we recommend continued regular follow-up with primary dermatologist every 6 months or sooner for any growing, bleeding, or changing lesions. 3. Prognosis and future surveillance discussed. 4. Letter with treatment outcome sent to referring provider. 5. Pain acetaminophen/ibuprofen/tramadol 50 mg   MOHS MICROGRAPHIC SURGERY AND RECONSTRUCTION  Initial size:   1.2 x 1.8 cm Surgical defect/wound size: 2.9 x 1.6 cm Anesthesia:    0.33% lidocaine with 1:200,000 epinephrine EBL:    <5 mL Complications:  None Repair type:   Complex SQ suture:   5-0 Monocryl Cutaneous suture:  6-0 Plain gut Final size of the repair: 4.9 cm  Stages: 2  STAGE I: Anesthesia achieved with 0.5% lidocaine with 1:200,000 epinephrine. ChloraPrep applied. 1 section(s) excised using Mohs technique (this includes total peripheral and deep tissue margin excision and evaluation with frozen sections, excised and interpreted by the same physician). The tumor was first debulked and then excised with an approx. 2 mm margin.  Hemostasis was achieved with electrocautery as needed.  The specimen was then oriented, subdivided/relaxed, inked, and processed using Mohs technique.    Frozen section analysis revealed a positive margin for islands of cells with peripheral palisading and a haphazard arrangement of the more central cells in the peripheral margin.    STAGE II: An additional 2 mm margin was excised.  Hemostasis was achieved with electrocautery as needed.  The specimen was then oriented, subdivided/relaxed, inked, and processed using Mohs technique. Evaluation of slides by the Mohs surgeon revealed clear tumor margins.  Reconstruction   The surgical wound was then cleaned, prepped, and re-anesthetized as above. Wound edges were undermined extensively along at least one entire edge and at a distance equal to or greater than the width of the defect (see wound defect size above)  in order to achieve closure and decrease wound tension and anatomic distortion. Redundant tissue repair including standing cone removal was performed. Hemostasis was achieved with electrocautery. Subcutaneous and epidermal tissues were approximated with the above sutures. The surgical site was then lightly scrubbed with sterile, saline-soaked gauze. The area was then bandaged using Vaseline ointment, non-adherent gauze, gauze pads, and tape to provide an adequate pressure dressing. The patient tolerated the procedure well, was given detailed written and verbal wound care instructions, and was discharged in good condition.   The patient will follow-up: 4 weeks.   Documentation: I have reviewed the above documentation for accuracy and completeness, and I agree with the above.  Gwenith Daily, MD

## 2023-06-14 NOTE — Patient Instructions (Signed)

## 2023-07-10 ENCOUNTER — Encounter: Payer: Self-pay | Admitting: Dermatology

## 2023-07-10 ENCOUNTER — Ambulatory Visit: Payer: Medicare HMO | Admitting: Dermatology

## 2023-07-10 DIAGNOSIS — D0439 Carcinoma in situ of skin of other parts of face: Secondary | ICD-10-CM | POA: Diagnosis not present

## 2023-07-10 DIAGNOSIS — L570 Actinic keratosis: Secondary | ICD-10-CM

## 2023-07-10 DIAGNOSIS — D492 Neoplasm of unspecified behavior of bone, soft tissue, and skin: Secondary | ICD-10-CM | POA: Diagnosis not present

## 2023-07-10 DIAGNOSIS — D099 Carcinoma in situ, unspecified: Secondary | ICD-10-CM

## 2023-07-10 DIAGNOSIS — W908XXA Exposure to other nonionizing radiation, initial encounter: Secondary | ICD-10-CM | POA: Diagnosis not present

## 2023-07-10 DIAGNOSIS — L578 Other skin changes due to chronic exposure to nonionizing radiation: Secondary | ICD-10-CM | POA: Diagnosis not present

## 2023-07-10 HISTORY — DX: Carcinoma in situ, unspecified: D09.9

## 2023-07-10 NOTE — Patient Instructions (Signed)

## 2023-07-10 NOTE — Progress Notes (Signed)
   Follow-Up Visit   Subjective  Angel Matthews is a 72 y.o. female who presents for the following: check spot nose The patient has spots, moles and lesions to be evaluated, some may be new or changing and the patient may have concern these could be cancer.   The following portions of the chart were reviewed this encounter and updated as appropriate: medications, allergies, medical history  Review of Systems:  No other skin or systemic complaints except as noted in HPI or Assessment and Plan.  Objective  Well appearing patient in no apparent distress; mood and affect are within normal limits.   A focused examination was performed of the following areas: face  Relevant exam findings are noted in the Assessment and Plan.  central nasal tip 5.78mm keratotic pap  L nasal dorsum x 4, R nasal dorsum x 1 (5) Hyperkeratotic macules  Assessment & Plan   ACTINIC DAMAGE - chronic, secondary to cumulative UV radiation exposure/sun exposure over time - diffuse scaly erythematous macules with underlying dyspigmentation - Recommend daily broad spectrum sunscreen SPF 30+ to sun-exposed areas, reapply every 2 hours as needed.  - Recommend staying in the shade or wearing long sleeves, sun glasses (UVA+UVB protection) and wide brim hats (4-inch brim around the entire circumference of the hat). - Call for new or changing lesions.   NEOPLASM OF SKIN central nasal tip Epidermal / dermal shaving  Informed consent: discussed and consent obtained   Patient was prepped and draped in usual sterile fashion: area prepped with alcohol. Anesthesia: the lesion was anesthetized in a standard fashion   Anesthetic:  1% lidocaine w/ epinephrine 1-100,000 buffered w/ 8.4% NaHCO3 Instrument used: flexible razor blade   Hemostasis achieved with: pressure, aluminum chloride and electrodesiccation   Outcome: patient tolerated procedure well   Post-procedure details: wound care instructions given    Post-procedure details comment:  Ointment and small bandage applied Specimen 1 - Surgical pathology Differential Diagnosis: D48.5 Hypertropic AK r/o SCC  Check Margins: No 5.40mm keratotic pap  HYPERTROPHIC ACTINIC KERATOSIS (5) L nasal dorsum x 4, R nasal dorsum x 1 (5) Destruction of lesion - L nasal dorsum x 4, R nasal dorsum x 1 (5)  Destruction method: cryotherapy   Informed consent: discussed and consent obtained   Lesion destroyed using liquid nitrogen: Yes   Region frozen until ice ball extended beyond lesion: Yes   Outcome: patient tolerated procedure well with no complications   Post-procedure details: wound care instructions given   Additional details:  Prior to procedure, discussed risks of blister formation, small wound, skin dyspigmentation, or rare scar following cryotherapy. Recommend Vaseline ointment to treated areas while healing.   Return for prn pending bx results.  I, Ardis Rowan, RMA, am acting as scribe for Willeen Niece, MD .   Documentation: I have reviewed the above documentation for accuracy and completeness, and I agree with the above.  Willeen Niece, MD

## 2023-07-13 LAB — SURGICAL PATHOLOGY

## 2023-07-14 ENCOUNTER — Encounter: Payer: Self-pay | Admitting: Dermatology

## 2023-07-14 ENCOUNTER — Ambulatory Visit: Payer: Medicare HMO | Admitting: Dermatology

## 2023-07-14 VITALS — BP 146/77 | HR 88

## 2023-07-14 DIAGNOSIS — C4491 Basal cell carcinoma of skin, unspecified: Secondary | ICD-10-CM

## 2023-07-14 DIAGNOSIS — L905 Scar conditions and fibrosis of skin: Secondary | ICD-10-CM

## 2023-07-14 DIAGNOSIS — Z85828 Personal history of other malignant neoplasm of skin: Secondary | ICD-10-CM | POA: Diagnosis not present

## 2023-07-14 NOTE — Progress Notes (Signed)
   Follow Up Visit   Subjective  Angel Matthews is a 72 y.o. female who presents for the following: follow up from Mohs surgery   The patient presents for follow up from Mohs surgery for a BCC on the left cheek, treated on 06/14/23, repaired with linear. The patient has been bandaging the wound as directed. The endorse the following concerns: no questions or concerns at this time.  The following portions of the chart were reviewed this encounter and updated as appropriate: medications, allergies, medical history  Review of Systems:  No other skin or systemic complaints except as noted in HPI or Assessment and Plan.  Objective  Well appearing patient in no apparent distress; mood and affect are within normal limits.  A full examination was performed including scalp, head, and face. All findings within normal limits unless otherwise noted below.  Healing wound with mild erythema  Relevant physical exam findings are noted in the Assessment and Plan.    Assessment & Plan   Healing s/p Mohs for [facility], treated on 06/14/23, repaired with linear repair - Reassured that wound is healing well - No evidence of infection - No swelling, induration, purulence, dehiscence, or tenderness out of proportion to the clinical exam, see photo above - Discussed that scars take up to 12 months to mature from the date of surgery - Recommend SPF 30+ to scar daily to prevent purple color from UV exposure during scar maturation process - Discussed that erythema and raised appearance of scar will fade over the next 4-6 months - OK to start scar massage at 4-6 weeks post-op - Can consider silicone based products for scar healing starting at 6 weeks post-op - Ok to discontinue ointment daily.    Return if symptoms worsen or fail to improve.  Dominga Ferry, Surg Tech III, am acting as scribe for Gwenith Daily, MD.   Documentation: I have reviewed the above documentation for accuracy and completeness, and I  agree with the above.  Gwenith Daily, MD

## 2023-07-14 NOTE — Patient Instructions (Addendum)
 Wound Care Instructions for After Surgery  On the day following your surgery, you should begin doing daily dressing changes until your sutures are removed: Remove the bandage. Cleanse the wound gently with soap and water.  Make sure you then dry the skin surrounding the wound completely or the tape will not stick to the skin. Do not use cotton balls on the wound. After the wound is clean and dry, apply the ointment (either prescription antibiotic prescribed by your doctor or plain Vaseline if nothing was prescribed) gently with a Q-tip. If you are using a bandaid to cover: Apply a bandaid large enough to cover the entire wound. If you do not have a bandaid large enough to cover the wound OR if you are sensitive to bandaid adhesive: Cut a non-stick pad (such as Telfa) to fit the size of the wound.  Cover the wound with the non-stick pad. If the wound is draining, you may want to add a small amount of gauze on top of the non-stick pad for a little added compression to the area. Use tape to seal the area completely.  For the next 1-2 weeks: Be sure to keep the wound moist with ointment 24/7 to ensure best healing. If you are unable to cover the wound with a bandage to hold the ointment in place, you may need to reapply the ointment several times a day. Do not bend over or lift heavy items to reduce the chance of elevated blood pressure to the wound. Do not participate in particularly strenuous activities.  Below is a list of dressing supplies you might need.  Cotton-tipped applicators - Q-tips Gauze pads (2x2 and/or 4x4) - All-Purpose Sponges New and clean tube of petroleum jelly (Vaseline) OR prescription antibiotic ointment if prescribed Either a bandaid large enough to cover the entire wound OR non-stick dressing material (Telfa) and Tape (Paper or Hypafix)  FOR ADULT SURGERY PATIENTS: If you need something for pain relief, you may take 1 extra strength Tylenol (acetaminophen) and 2  ibuprofen (200 mg) together every 4 hours as needed. (Do not take these medications if you are allergic to them or if you know you cannot take them for any other reason). Typically you may only need pain medication for 1-3 days.   Comments on the Post-Operative Period Slight swelling and redness often appear around the wound. This is normal and will disappear within several days following the surgery. The healing wound will drain a brownish-red-yellow discharge during healing. This is a normal phase of wound healing. As the wound begins to heal, the drainage may increase in amount. Again, this drainage is normal. Notify us if the drainage becomes persistently bloody, excessively swollen, or intensely painful or develops a foul odor or red streaks.  The healing wound will also typically be itchy. This is normal. If you have severe or persistent pain, Notify us if the discomfort is severe or persistent. Avoid alcoholic beverages when taking pain medicine.  In Case of Wound Hemorrhage A wound hemorrhage is when the bandage suddenly becomes soaked with bright red blood and flows profusely. If this happens, sit down or lie down with your head elevated. If the wound has a dressing on it, do not remove the dressing. Apply pressure to the existing gauze. If the wound is not covered, use a gauze pad to apply pressure and continue applying the pressure for 20 minutes without peeking. DO NOT COVER THE WOUND WITH A LARGE TOWEL OR WASH CLOTH. Release your hand from the  wound site but do not remove the dressing. If the bleeding has stopped, gently clean around the wound. Leave the dressing in place for 24 hours if possible. This wait time allows the blood vessels to close off so that you do not spark a new round of bleeding by disrupting the newly clotted blood vessels with an immediate dressing change. If the bleeding does not subside, continue to hold pressure for 40 minutes. If bleeding continues, page your  physician, contact an After Hours clinic or go to the Emergency Room.    Important Information  Due to recent changes in healthcare laws, you may see results of your pathology and/or laboratory studies on MyChart before the doctors have had a chance to review them. We understand that in some cases there may be results that are confusing or concerning to you. Please understand that not all results are received at the same time and often the doctors may need to interpret multiple results in order to provide you with the best plan of care or course of treatment. Therefore, we ask that you please give [place] 2 business days to thoroughly review all your results before contacting the office for clarification. Should we see a critical lab result, you will be contacted sooner.   If You Need Anything After Your Visit  If you have any questions or concerns for your doctor, please call our main line at [phone] If no one answers, please leave a voicemail as directed and we will return your call as soon as possible. Messages left after 4 pm will be answered the following business day.   You may also send [place] a message via MyChart. We typically respond to MyChart messages within 1-2 business days.  For prescription refills, please ask your pharmacy to contact our office. Our fax number is [phone].  If you have an urgent issue when the clinic is closed that cannot wait until the next business day, you can page your doctor at the number below.    Please note that while we do our best to be available for urgent issues outside of office hours, we are not available 24/7.   If you have an urgent issue and are unable to reach [place], you may choose to seek medical care at your doctor's office, retail clinic, urgent care center, or emergency room.  If you have a medical emergency, please immediately call 911 or go to the emergency department. In the event of inclement weather, please call our main line at  [phone] for an update on the status of any delays or closures.  Dermatology Medication Tips: Please keep the boxes that topical medications come in in order to help keep track of the instructions about where and how to use these. Pharmacies typically print the medication instructions only on the boxes and not directly on the medication tubes.   If your medication is too expensive, please contact our office at [phone] or send [place] a message through MyChart.   We are unable to tell what your co-pay for medications will be in advance as this is different depending on your insurance coverage. However, we may be able to find a substitute medication at lower cost or fill out paperwork to get insurance to cover a needed medication.   If a prior authorization is required to get your medication covered by your insurance company, please allow [place] 1-2 business days to complete this process.  Drug prices often vary depending on where the prescription is filled and  some pharmacies may offer cheaper prices.  The website www.goodrx.com contains coupons for medications through different pharmacies. The prices here do not account for what the cost may be with help from insurance (it may be cheaper with your insurance), but the website can give you the price if you did not use any insurance.  - You can print the associated coupon and take it with your prescription to the pharmacy.  - You may also stop by our office during regular business hours and pick up a GoodRx coupon card.  - If you need your prescription sent electronically to a different pharmacy, notify our office through [facility] or by phone at [phone]

## 2023-07-20 ENCOUNTER — Telehealth: Payer: Self-pay

## 2023-07-20 NOTE — Telephone Encounter (Addendum)
 Tried calling patient regarding bx results and need for appointment and treatment options. Patient did not answer. LM for patient to return call.    ----- Message from Rexene Rattler sent at 07/18/2023 11:37 AM EST ----- 1. Skin, central nasal tip :       SQUAMOUS CELL CARCINOMA IN SITU, HYPERTROPHIC, BASE INVOLVED   SCCIS skin cancer- there are several options for treatment, topical with 93fu/calcip.cream, EDC, or Mohs surgery.  She should be scheduled for a f/up appointment with Dr. MARLA or me about 3-4 weeks after biopsy to review options since it is a sensitive area cosmetically and to assess if it growing back.  If it is improved after biopsy, then topical treatment would be my first choice (less scarring).  If it is growing back, then I would recommend Mohs surgery vrs EDC.   - please call patient

## 2023-07-24 ENCOUNTER — Telehealth: Payer: Self-pay

## 2023-07-24 NOTE — Telephone Encounter (Signed)
 Patient advised and scheduled follow up appt. aw

## 2023-07-24 NOTE — Telephone Encounter (Signed)
-----   Message from Rexene Rattler sent at 07/18/2023 11:37 AM EST ----- 1. Skin, central nasal tip :       SQUAMOUS CELL CARCINOMA IN SITU, HYPERTROPHIC, BASE INVOLVED   SCCIS skin cancer- there are several options for treatment, topical with 32fu/calcip.cream, EDC, or Mohs surgery.  She should be scheduled for a f/up appointment with Dr. MARLA or me about 3-4 weeks after biopsy to review options since it is a sensitive area cosmetically and to assess if it growing back.  If it is improved after biopsy, then topical treatment would be my first choice (less scarring).  If it is growing back, then I would recommend Mohs surgery vrs EDC.   - please call patient

## 2023-08-17 NOTE — Telephone Encounter (Signed)
Opened in error; Disregard.

## 2023-08-21 ENCOUNTER — Ambulatory Visit (INDEPENDENT_AMBULATORY_CARE_PROVIDER_SITE_OTHER): Payer: Medicare HMO | Admitting: Dermatology

## 2023-08-21 ENCOUNTER — Encounter: Payer: Self-pay | Admitting: Dermatology

## 2023-08-21 DIAGNOSIS — Z5111 Encounter for antineoplastic chemotherapy: Secondary | ICD-10-CM | POA: Diagnosis not present

## 2023-08-21 DIAGNOSIS — D0439 Carcinoma in situ of skin of other parts of face: Secondary | ICD-10-CM

## 2023-08-21 DIAGNOSIS — L570 Actinic keratosis: Secondary | ICD-10-CM | POA: Diagnosis not present

## 2023-08-21 DIAGNOSIS — L578 Other skin changes due to chronic exposure to nonionizing radiation: Secondary | ICD-10-CM

## 2023-08-21 DIAGNOSIS — W908XXA Exposure to other nonionizing radiation, initial encounter: Secondary | ICD-10-CM

## 2023-08-21 DIAGNOSIS — Z7189 Other specified counseling: Secondary | ICD-10-CM

## 2023-08-21 MED ORDER — FLUOROURACIL 5 % EX CREA: TOPICAL_CREAM | Freq: Two times a day (BID) | CUTANEOUS | 1 refills | Status: AC

## 2023-08-21 NOTE — Progress Notes (Signed)
   Follow-Up Visit   Subjective  Angel Matthews is a 73 y.o. female who presents for the following: SCC IS bx proven central nasal tip, f/u, discuss treatment options  The patient has spots, moles and lesions to be evaluated, some may be new or changing and the patient may have concern these could be cancer.   The following portions of the chart were reviewed this encounter and updated as appropriate: medications, allergies, medical history  Review of Systems:  No other skin or systemic complaints except as noted in HPI or Assessment and Plan.  Objective  Well appearing patient in no apparent distress; mood and affect are within normal limits.   A focused examination was performed of the following areas: face  Relevant exam findings are noted in the Assessment and Plan.     Assessment & Plan   SQUAMOUS CELL CARCINOMA IN SITU, HYPERTROPHIC, BASE INVOLVED  Central nasal tip Exam: pink bx site  Treatment Plan: Discussed treatment options 5FU/Calcipotriene vs EDC vs mohs Start 5FU/Calcipotriene cr bid to biopsy site for 1 month at nasal tip Recheck on f/up  Reviewed course of treatment and expected reaction.  Patient advised to expect inflammation and crusting and advised that erosions are possible.  Patient advised to be diligent with sun protection during and after treatment. Handout with details of how to apply medication and what to expect provided. Counseled to keep medication out of reach of children and pets.   ACTINIC DAMAGE WITH PRECANCEROUS ACTINIC KERATOSES Counseling for Topical Chemotherapy Management: Patient exhibits: - Severe, confluent actinic changes with pre-cancerous actinic keratoses that is secondary to cumulative UV radiation exposure over time - Condition that is severe; chronic, not at goal. - diffuse scaly erythematous macules and papules with underlying dyspigmentation - Discussed Prescription "Field Treatment" topical Chemotherapy for Severe, Chronic  Confluent Actinic Changes with Pre-Cancerous Actinic Keratoses Field treatment involves treatment of an entire area of skin that has confluent Actinic Changes (Sun/ Ultraviolet light damage) and PreCancerous Actinic Keratoses by method of PhotoDynamic Therapy (PDT) and/or prescription Topical Chemotherapy agents such as 5-fluorouracil, 5-fluorouracil/calcipotriene, and/or imiquimod.  The purpose is to decrease the number of clinically evident and subclinical PreCancerous lesions to prevent progression to development of skin cancer by chemically destroying early precancer changes that may or may not be visible.  It has been shown to reduce the risk of developing skin cancer in the treated area. As a result of treatment, redness, scaling, crusting, and open sores may occur during treatment course. One or more than one of these methods may be used and may have to be used several times to control, suppress and eliminate the PreCancerous changes. Discussed treatment course, expected reaction, and possible side effects. - Recommend daily broad spectrum sunscreen SPF 30+ to sun-exposed areas, reapply every 2 hours as needed.  - Staying in the shade or wearing long sleeves, sun glasses (UVA+UVB protection) and wide brim hats (4-inch brim around the entire circumference of the hat) are also recommended. - Call for new or changing lesions.  - May start 5FU/Calcipotriene bid x 7 days to entire nose including biopsy site   Return in about 2 months (around 10/19/2023) for f/u SCC IS on nose.  I, Ardis Rowan, RMA, am acting as scribe for Willeen Niece, MD .   Documentation: I have reviewed the above documentation for accuracy and completeness, and I agree with the above.  Willeen Niece, MD

## 2023-08-21 NOTE — Patient Instructions (Addendum)
Instructions for [facility] Medications  One or more of your medications was sent to the [facility] mail order compounding pharmacy. You will receive an email from them and can purchase the medicine through that link. It will then be mailed to your home at the address you confirmed. If for any reason you do not receive an email from them, please check your spam folder. If you still do not find the email, please let us know. [facility] phone number is [phone].   Start 5FU/Calcipotriene twice a day to nasal tip for 1 month  Can also treat whole nose twice a day for 7 days   5-Fluorouracil/Calcipotriene Patient Education   Actinic keratoses are the dry, red scaly spots on the skin caused by sun damage. A portion of these spots can turn into skin cancer with time, and treating them can help prevent development of skin cancer.   Treatment of these spots requires removal of the defective skin cells. There are various ways to remove actinic keratoses, including freezing with liquid nitrogen, treatment with creams, or treatment with a blue light procedure in the office.   5-fluorouracil cream is a topical cream used to treat actinic keratoses. It works by interfering with the growth of abnormal fast-growing skin cells, such as actinic keratoses. These cells peel off and are replaced by healthy ones.   5-fluorouracil/calcipotriene is a combination of the 5-fluorouracil cream with a vitamin D analog cream called calcipotriene. The calcipotriene alone does not treat actinic keratoses. However, when it is combined with 5-fluorouracil, it helps the 5-fluorouracil treat the actinic keratoses much faster so that the same results can be achieved with a much shorter treatment time.  INSTRUCTIONS FOR 5-FLUOROURACIL/CALCIPOTRIENE CREAM:   5-fluorouracil/calcipotriene cream typically only needs to be used for 4-7 days. A thin layer should be applied twice a day to the treatment areas recommended  by your physician.   If your physician prescribed you separate tubes of 5-fluourouracil and calcipotriene, apply a thin layer of 5-fluorouracil followed by a thin layer of calcipotriene.   Avoid contact with your eyes, nostrils, and mouth. Do not use 5-fluorouracil/calcipotriene cream on infected or open wounds.   You will develop redness, irritation and some crusting at areas where you have pre-cancer damage/actinic keratoses. IF YOU DEVELOP PAIN, BLEEDING, OR SIGNIFICANT CRUSTING, STOP THE TREATMENT EARLY - you have already gotten a good response and the actinic keratoses should clear up well.  Wash your hands after applying 5-fluorouracil 5% cream on your skin.   A moisturizer or sunscreen with a minimum SPF 30 should be applied each morning.   Once you have finished the treatment, you can apply a thin layer of Vaseline twice a day to irritated areas to soothe and calm the areas more quickly. If you experience significant discomfort, contact your physician.  For some patients it is necessary to repeat the treatment for best results.  SIDE EFFECTS: When using 5-fluorouracil/calcipotriene cream, you may have mild irritation, such as redness, dryness, swelling, or a mild burning sensation. This usually resolves within 2 weeks. The more actinic keratoses you have, the more redness and inflammation you can expect during treatment. Eye irritation has been reported rarely. If this occurs, please let us know.  If you have any trouble using this cream, please call the office. If you have any other questions about this information, please do not hesitate to ask me before you leave the office.   Due to recent changes in healthcare laws, you may see  results of your pathology and/or laboratory studies on MyChart before the doctors have had a chance to review them. We understand that in some cases there may be results that are confusing or concerning to you. Please understand that not all results are  received at the same time and often the doctors may need to interpret multiple results in order to provide you with the best plan of care or course of treatment. Therefore, we ask that you please give [place] 2 business days to thoroughly review all your results before contacting the office for clarification. Should we see a critical lab result, you will be contacted sooner.   If You Need Anything After Your Visit  If you have any questions or concerns for your doctor, please call our main line at [phone] and press option 4 to reach your doctor's medical assistant. If no one answers, please leave a voicemail as directed and we will return your call as soon as possible. Messages left after 4 pm will be answered the following business day.   You may also send [place] a message via MyChart. We typically respond to MyChart messages within 1-2 business days.  For prescription refills, please ask your pharmacy to contact our office. Our fax number is [phone].  If you have an urgent issue when the clinic is closed that cannot wait until the next business day, you can page your doctor at the number below.    Please note that while we do our best to be available for urgent issues outside of office hours, we are not available 24/7.   If you have an urgent issue and are unable to reach [place], you may choose to seek medical care at your doctor's office, retail clinic, urgent care center, or emergency room.  If you have a medical emergency, please immediately call 911 or go to the emergency department.  Pager Numbers  - Dr. Gwen Pounds: [phone]  - Dr. Roseanne Reno: [phone]  - Dr. Katrinka Blazing: [phone]   In the event of inclement weather, please call our main line at [phone] for an update on the status of any delays or closures.  Dermatology Medication Tips: Please keep the boxes that topical medications come in in order to help keep track of the instructions about where and how to use these.  Pharmacies typically print the medication instructions only on the boxes and not directly on the medication tubes.   If your medication is too expensive, please contact our office at [phone] option 4 or send [place] a message through MyChart.   We are unable to tell what your co-pay for medications will be in advance as this is different depending on your insurance coverage. However, we may be able to find a substitute medication at lower cost or fill out paperwork to get insurance to cover a needed medication.   If a prior authorization is required to get your medication covered by your insurance company, please allow [place] 1-2 business days to complete this process.  Drug prices often vary depending on where the prescription is filled and some pharmacies may offer cheaper prices.  The website www.goodrx.com contains coupons for medications through different pharmacies. The prices here do not account for what the cost may be with help from insurance (it may be cheaper with your insurance), but the website can give you the price if you did not use any insurance.  - You can print the associated coupon and take it with your prescription to the pharmacy.  - You  may also stop by our office during regular business hours and pick up a GoodRx coupon card.  - If you need your prescription sent electronically to a different pharmacy, notify our office through [facility] or by phone at [phone] option 4.     Si Usted Necesita Algo Despus de Su Visita  Tambin puede enviarnos un mensaje a travs de Clinical cytogeneticist. Por lo general respondemos a los mensajes de MyChart en el transcurso de 1 a 2 das hbiles.  Para renovar recetas, por favor pida a su farmacia que se ponga en contacto con nuestra oficina. Annie Sable de fax es [place] [phone].  Si tiene un asunto urgente cuando la clnica est cerrada y que no puede esperar hasta el siguiente da hbil, puede llamar/localizar a su doctor(a) al nmero  que aparece a continuacin.   Por favor, tenga en cuenta que aunque hacemos todo lo posible para estar disponibles para asuntos urgentes fuera del horario de [place], no estamos disponibles las 24 horas del da, los 7 [address]  [address] de la [place].   Si tiene un problema urgente y no puede comunicarse con nosotros, puede optar por buscar atencin mdica  en el consultorio de su doctor(a), en una clnica privada, en un centro de atencin urgente o en una sala de emergencias.  Si tiene Engineer, drilling, por favor llame inmediatamente al 911 o vaya a la sala de emergencias.  Nmeros de bper  - Dr. Gwen Pounds: [phone]  - Dra. Roseanne Reno: [phone]  - Dr. Katrinka Blazing: [phone]   En caso de inclemencias del tiempo, por favor llame a Lacy Duverney principal al [phone] para una actualizacin sobre el [place] de cualquier retraso o cierre.  Consejos para la medicacin en dermatologa: Por favor, guarde las cajas en las que vienen los medicamentos de uso tpico para ayudarle a seguir las instrucciones sobre dnde y cmo usarlos. Las farmacias generalmente imprimen las instrucciones del medicamento slo en las cajas y no directamente en los tubos del [place].   Si su medicamento es muy caro, por favor, pngase en contacto con Rolm Gala llamando al [phone] y presione la opcin 4 o envenos un mensaje a travs de Clinical cytogeneticist.   No podemos decirle cul ser su copago por los medicamentos por adelantado ya que esto es diferente dependiendo de la cobertura de su seguro. Sin embargo, es posible que podamos encontrar un medicamento sustituto a Audiological scientist un formulario para que el seguro cubra el medicamento que se considera necesario.   Si se requiere una autorizacin previa para que su compaa de seguros [place] su medicamento, por favor permtanos de 1 a 2 das hbiles para completar [address]  Los precios de los medicamentos varan con frecuencia dependiendo del Environmental consultant de dnde se  surte la receta y alguna farmacias pueden ofrecer precios ms baratos.  El sitio web www.goodrx.com tiene cupones para medicamentos de Health and safety inspector. Los precios aqu no tienen en cuenta lo que podra costar con la ayuda del seguro (puede ser ms barato con su seguro), pero el sitio web puede darle el precio si no utiliz Tourist information centre manager.  - Puede imprimir el cupn correspondiente y llevarlo con su receta a la farmacia.  - Tambin puede pasar por nuestra oficina durante el horario de atencin regular y Education officer, museum una tarjeta de cupones de GoodRx.  - Si necesita que su receta se enve electrnicamente a Psychiatrist, informe a nuestra oficina a travs de MyChart de [place] o por telfono llamando al [phone] y  presione la opcin 4.

## 2023-09-14 ENCOUNTER — Ambulatory Visit: Payer: PRIVATE HEALTH INSURANCE | Admitting: Dermatology

## 2023-09-14 ENCOUNTER — Telehealth: Payer: Self-pay

## 2023-09-14 NOTE — Telephone Encounter (Signed)
 Patient left voicemail that she thought she was to use the 5FU cream for 30 days and then stop but wanted to know should she wait until May for her follow up.   I have left voicemail for patient to return my call to clarify topical directions and confirm follow up appt. aw

## 2023-10-11 ENCOUNTER — Ambulatory Visit: Payer: Medicare HMO | Admitting: Dermatology

## 2024-08-20 ENCOUNTER — Ambulatory Visit: Admitting: Dermatology

## 2024-09-25 ENCOUNTER — Ambulatory Visit: Admitting: Dermatology
